# Patient Record
Sex: Male | Born: 1970
Health system: Southern US, Community
[De-identification: ages and names within clinical notes are randomized; demographics above are authoritative.]

## PROBLEM LIST (undated history)

## (undated) DIAGNOSIS — S83281A Other tear of lateral meniscus, current injury, right knee, initial encounter: Secondary | ICD-10-CM

## (undated) DIAGNOSIS — J189 Pneumonia, unspecified organism: Secondary | ICD-10-CM

## (undated) DIAGNOSIS — C4491 Basal cell carcinoma of skin, unspecified: Secondary | ICD-10-CM

## (undated) DIAGNOSIS — T7840XA Allergy, unspecified, initial encounter: Secondary | ICD-10-CM

## (undated) DIAGNOSIS — G4730 Sleep apnea, unspecified: Secondary | ICD-10-CM

## (undated) DIAGNOSIS — K219 Gastro-esophageal reflux disease without esophagitis: Secondary | ICD-10-CM

## (undated) HISTORY — DX: Gastro-esophageal reflux disease without esophagitis: K21.9

## (undated) HISTORY — PX: CARPAL TUNNEL RELEASE: SHX101

## (undated) HISTORY — DX: Sleep apnea, unspecified: G47.30

## (undated) HISTORY — DX: Allergy, unspecified, initial encounter: T78.40XA

---
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
## 2004-09-09 ENCOUNTER — Emergency Department (HOSPITAL_COMMUNITY): Admission: EM | Admit: 2004-09-09 | Discharge: 2004-09-09 | Payer: Self-pay | Admitting: Family Medicine

## 2008-10-26 ENCOUNTER — Ambulatory Visit: Payer: Self-pay | Admitting: Obstetrics & Gynecology

## 2008-10-27 ENCOUNTER — Encounter: Payer: Self-pay | Admitting: Obstetrics & Gynecology

## 2008-10-27 LAB — CONVERTED CEMR LAB
Barbiturate Quant, Ur: NEGATIVE
Benzodiazepines.: NEGATIVE
Cocaine Metabolites: NEGATIVE
Creatinine,U: 68.2 mg/dL
Methadone: NEGATIVE

## 2011-08-25 ENCOUNTER — Institutional Professional Consult (permissible substitution): Payer: Self-pay | Admitting: Pulmonary Disease

## 2011-09-06 ENCOUNTER — Encounter: Payer: Self-pay | Admitting: Pulmonary Disease

## 2011-09-06 ENCOUNTER — Ambulatory Visit (INDEPENDENT_AMBULATORY_CARE_PROVIDER_SITE_OTHER): Payer: 59 | Admitting: Pulmonary Disease

## 2011-09-06 VITALS — BP 140/98 | HR 73 | Temp 98.8°F | Ht 74.0 in | Wt 236.0 lb

## 2011-09-06 DIAGNOSIS — G4733 Obstructive sleep apnea (adult) (pediatric): Secondary | ICD-10-CM | POA: Insufficient documentation

## 2011-09-06 DIAGNOSIS — R0683 Snoring: Secondary | ICD-10-CM

## 2011-09-06 DIAGNOSIS — R0609 Other forms of dyspnea: Secondary | ICD-10-CM

## 2011-09-06 NOTE — Patient Instructions (Addendum)
Will do home sleep test to evaluate for sleep apnea Work on weight loss Will call with results of study.

## 2011-09-06 NOTE — Assessment & Plan Note (Signed)
The patient has significant snoring, and abnormal breathing pattern during sleep, and does give a history that suggests some degree of sleep fragmentation.  He is overweight, and does have a large neck, and therefore is at risk for sleep disordered breathing.  He also has some degree of sleepiness during his awake hours.  I think he needs to have a sleep study in order to evaluate for clinically significant sleep apnea.  He is very healthy, and does not have issues with insomnia.  He is therefore a good candidate for home sleep testing.

## 2011-09-06 NOTE — Progress Notes (Signed)
  Subjective:    Patient ID: Ricky Hawkins, male    DOB: 03/03/1970, 41 y.o.   MRN: [id number]  HPI The pt is a 41 y/o male who comes in today for evaluation of loud snoring and possibly osa.  His wife notes that he has snoring, as well as an abnormal breathing pattern during sleep.  He usually awakens 3 times a night, and is rested only 50% of the mornings.  He stays very active at work, and therefore is unsure if he has an issue with sleep pressure.  He does admit that he can doze in the evenings watching television or movies.  He also has sleep pressure while driving longer distances on occasion.  He states that his weight is up about 3-4 pounds over the last few years, and his Epworth score today is only 3.  His neck size is 16-1/2 inches.  Sleep Questionnaire: What time do you typically go to bed?( Between what hours) 11pm How long does it take you to fall asleep? 30 minutes How many times during the night do you wake up? 2 What time do you get out of bed to start your day? 0600 Do you drive or operate heavy machinery in your occupation? No How much has your weight changed (up or down) over the past two years? (In pounds) 4 lb (1.814 kg) Have you ever had a sleep study before? No Do you currently use CPAP? No Do you wear oxygen at any time? No    Review of Systems  Constitutional: Negative for fever and unexpected weight change.  HENT: Negative for ear pain, nosebleeds, congestion, sore throat, rhinorrhea, sneezing, trouble swallowing, dental problem, postnasal drip and sinus pressure.   Eyes: Negative for redness and itching.  Respiratory: Negative for cough, chest tightness, shortness of breath and wheezing.   Cardiovascular: Negative for palpitations and leg swelling.  Gastrointestinal: Negative for nausea and vomiting.  Genitourinary: Negative for dysuria.  Musculoskeletal: Positive for joint swelling.  Skin: Negative for rash.  Neurological: Negative for headaches.  Hematological: Does  not bruise/bleed easily.  Psychiatric/Behavioral: Negative for dysphoric mood. The patient is not nervous/anxious.        Objective:   Physical Exam Constitutional:  Well developed, no acute distress  HENT:  Nares patent without discharge, but mild deviation to left  Oropharynx without exudate, palate and uvula are mildly elongated.   Eyes:  Perrla, eomi, no scleral icterus  Neck:  No JVD, no TMG  Cardiovascular:  Normal rate, regular rhythm, no rubs or gallops.  No murmurs        Intact distal pulses  Pulmonary :  Normal breath sounds, no stridor or respiratory distress   No rales, rhonchi, or wheezing  Abdominal:  Soft, nondistended, bowel sounds present.  No tenderness noted.   Musculoskeletal:  No lower extremity edema noted.  Lymph Nodes:  No cervical lymphadenopathy noted  Skin:  No cyanosis noted  Neurologic:  Alert, appropriate, moves all 4 extremities without obvious deficit.         Assessment & Plan:

## 2011-09-12 ENCOUNTER — Telehealth: Payer: Self-pay | Admitting: Pulmonary Disease

## 2011-09-12 NOTE — Telephone Encounter (Signed)
Pt needs ov to review recent sleep study.  Let him know is abnormal. If I don't have anything soon, can double book somewhere.

## 2011-09-13 ENCOUNTER — Ambulatory Visit (INDEPENDENT_AMBULATORY_CARE_PROVIDER_SITE_OTHER): Payer: 59 | Admitting: Pulmonary Disease

## 2011-09-13 DIAGNOSIS — R0683 Snoring: Secondary | ICD-10-CM

## 2011-09-13 DIAGNOSIS — G4733 Obstructive sleep apnea (adult) (pediatric): Secondary | ICD-10-CM

## 2011-09-13 NOTE — Telephone Encounter (Signed)
Will forward to Lori per her request.  

## 2011-09-18 NOTE — Telephone Encounter (Signed)
Pt is scheduled for follow-up on home sleep test Wed., 09/20/11 @ 2:45.

## 2011-09-20 ENCOUNTER — Ambulatory Visit (INDEPENDENT_AMBULATORY_CARE_PROVIDER_SITE_OTHER): Payer: 59 | Admitting: Pulmonary Disease

## 2011-09-20 ENCOUNTER — Encounter: Payer: Self-pay | Admitting: Pulmonary Disease

## 2011-09-20 VITALS — BP 110/72 | HR 60 | Temp 98.4°F | Ht 76.0 in | Wt 233.8 lb

## 2011-09-20 DIAGNOSIS — R0989 Other specified symptoms and signs involving the circulatory and respiratory systems: Secondary | ICD-10-CM

## 2011-09-20 DIAGNOSIS — R0683 Snoring: Secondary | ICD-10-CM

## 2011-09-20 NOTE — Progress Notes (Signed)
  Subjective:    Patient ID: Ricky Hawkins, male    DOB: 13-Apr-1970, 41 y.o.   MRN: [id number]  HPI Patient comes in today for followup after his recent sleep test.  He was found to have mild obstructive sleep apnea, with an AHI of 12 events per hour.  He did have oxygen desaturation as low as 78% transiently.  I have reviewed this study with him in detail, and answered all of his questions.   Review of Systems  Constitutional: Negative for fever and unexpected weight change.  HENT: Negative for ear pain, nosebleeds, congestion, sore throat, rhinorrhea, sneezing, trouble swallowing, dental problem, postnasal drip and sinus pressure.   Eyes: Negative for redness and itching.  Respiratory: Negative for cough, chest tightness, shortness of breath and wheezing.   Cardiovascular: Negative for palpitations and leg swelling.  Gastrointestinal: Negative for nausea and vomiting.  Genitourinary: Negative for dysuria.  Musculoskeletal: Negative for joint swelling.  Skin: Negative for rash.  Neurological: Negative for headaches.  Hematological: Does not bruise/bleed easily.  Psychiatric/Behavioral: Negative for dysphoric mood. The patient is not nervous/anxious.        Objective:   Physical Exam Well-developed male in no acute distress Nose without purulence or discharge noted Lower extremities without edema, no cyanosis Alert and oriented, moves all 4 extremities.       Assessment & Plan:

## 2011-09-20 NOTE — Patient Instructions (Addendum)
Will refer to Dr. Myrtis Ser for consideration of a dental appliance. Work on weight loss.

## 2011-09-20 NOTE — Assessment & Plan Note (Signed)
The patient has been found to have mild obstructive sleep apnea by his recent sleep study.  This is bothering his wife's sleep quite a bit, and he also is having sleep disruption with some daytime sleepiness.  I have reviewed the various treatment options with him, including a trial of weight loss alone, upper airway surgery, dental appliance, and also CPAP.  The patient would like to consider treating his sleep apnea aggressively while he is trying to work on weight loss.  I would not recommend upper airway surgery at this time, but would consider a dental appliance or CPAP.  He travels quite a bit, and is concerned about the inconvenience of CPAP.  He and I both agreed to a referral to dental medicine to be evaluated for possible oral appliance.

## 2011-10-03 ENCOUNTER — Ambulatory Visit (INDEPENDENT_AMBULATORY_CARE_PROVIDER_SITE_OTHER): Payer: 59 | Admitting: *Deleted

## 2011-10-03 ENCOUNTER — Encounter: Payer: Self-pay | Admitting: *Deleted

## 2011-10-03 DIAGNOSIS — Z23 Encounter for immunization: Secondary | ICD-10-CM

## 2011-10-03 MED ORDER — INFLUENZA VIRUS VACC SPLIT PF IM SUSP
0.5000 mL | Freq: Once | INTRAMUSCULAR | Status: AC
  Administered 2011-10-03: 0.5 mL via INTRAMUSCULAR

## 2012-01-03 HISTORY — PX: FRACTURE SURGERY: SHX138

## 2012-10-02 ENCOUNTER — Other Ambulatory Visit: Payer: Self-pay | Admitting: Family Medicine

## 2012-10-02 ENCOUNTER — Other Ambulatory Visit (INDEPENDENT_AMBULATORY_CARE_PROVIDER_SITE_OTHER): Payer: 59 | Admitting: Family Medicine

## 2012-10-02 DIAGNOSIS — Z0283 Encounter for blood-alcohol and blood-drug test: Secondary | ICD-10-CM

## 2012-10-02 DIAGNOSIS — Z021 Encounter for pre-employment examination: Secondary | ICD-10-CM

## 2012-10-02 DIAGNOSIS — Z23 Encounter for immunization: Secondary | ICD-10-CM

## 2012-10-02 MED ORDER — INFLUENZA VIRUS VACC SPLIT PF IM SUSP
0.5000 mL | Freq: Once | INTRAMUSCULAR | Status: AC
  Administered 2012-10-02: 0.5 mL via INTRAMUSCULAR

## 2012-10-03 LAB — DRUG SCREEN, URINE
Benzodiazepines.: NEGATIVE
Methadone: NEGATIVE
Phencyclidine (PCP): NEGATIVE
Propoxyphene: NEGATIVE

## 2012-11-19 ENCOUNTER — Ambulatory Visit (INDEPENDENT_AMBULATORY_CARE_PROVIDER_SITE_OTHER): Payer: 59 | Admitting: Sports Medicine

## 2012-11-19 ENCOUNTER — Encounter: Payer: Self-pay | Admitting: Sports Medicine

## 2012-11-19 VITALS — BP 133/84 | Ht 74.0 in | Wt 225.0 lb

## 2012-11-19 DIAGNOSIS — M7652 Patellar tendinitis, left knee: Secondary | ICD-10-CM

## 2012-11-19 DIAGNOSIS — M7650 Patellar tendinitis, unspecified knee: Secondary | ICD-10-CM

## 2012-11-19 DIAGNOSIS — M25569 Pain in unspecified knee: Secondary | ICD-10-CM

## 2012-11-19 DIAGNOSIS — S83207A Unspecified tear of unspecified meniscus, current injury, left knee, initial encounter: Secondary | ICD-10-CM | POA: Insufficient documentation

## 2012-11-19 MED ORDER — NITROGLYCERIN 0.2 MG/HR TD PT24: MEDICATED_PATCH | TRANSDERMAL | Status: DC

## 2012-11-19 NOTE — Assessment & Plan Note (Signed)
Patient with apparent tear in patellar tendon evidenced on ultrasound. Will treat with nitroglycerin protocol. Patient given handout with protocol and advised on side effects of nitroglycerin. He will be given a patellar strap as well to be worn with activity. Advised on declined squat to help stretch the tendon. Advised on ice massage of this area following activity. Will see in follow-up in 6 weeks.

## 2012-11-19 NOTE — Patient Instructions (Addendum)

## 2012-11-19 NOTE — Progress Notes (Signed)
  Subjective:    Patient ID: Ricky Hawkins, male    DOB: Jan 04, 1970, 42 y.o.   MRN: [id number]  HPI Patient is a 42 yo male new patient who presents for evaluation of left anterior knee pain. Notes 4 years ago he was found to have a partial to slight tear of his meniscus in this leg. His imaging at that time was done at [facility] orth[facility]. He notes he has had intermittent left knee pain for years. He notes current pain worsened starting 6 months ago in his left anterior knee when he started to train for a couch to 5k race. Noted his knee got puffy with this training and painful so he stopped. He notes some pain if he truns the wrong way, though notes no catching or popping. He denies a specific injury.  Sent by wife - Dr Borg  PMH: OSA Surgical Hx: none Medications: none  Review of Systems see HPI     Objective:   Physical Exam Well developed, well nourished  Left knee: no apparent swelling or erythema, patient with good ROM, tenderness to palpation over patellar tendon mid portion and at origin, no joint line tenderness, no noted laxity on ACL, PCL, MCL, and LCL testing, negative mcmurray, mild crepitus on extension of knee, 5/5 strength in quads and hamstrings  Right knee: no apparent swelling or erythema, patient with good ROM, no tenderness to palpation throughout the knee, no noted laxity on ACL, PCL, MCL, and LCL testing, negative mcmurray, 5/5 strength in quads and hamstrings  Ultrasound of left knee: medial meniscus with apparent calcification indicating old bruise or tear, lateral meniscus normal, in longitudinal view midline patellar tendon with spur off the inferior pole of the patella with surrounding hypoechogenicity, in cross section of patellar tendon there is a split in the mid portion with surrounding hypoechogenicity with width of tendon 2.74 cm and width of tear with 0.97 cm, note increased blood flow in this area on doppler, tibial tubercle normal        Assessment &  Plan:  Patellar tendinopathy  Please see individual problem list for plan.

## 2012-12-31 ENCOUNTER — Ambulatory Visit: Payer: 59

## 2013-05-07 ENCOUNTER — Encounter: Payer: Self-pay | Admitting: Sports Medicine

## 2013-05-07 ENCOUNTER — Ambulatory Visit (INDEPENDENT_AMBULATORY_CARE_PROVIDER_SITE_OTHER): Payer: BC Managed Care – PPO | Admitting: Sports Medicine

## 2013-05-07 VITALS — BP 126/75 | Ht 74.0 in | Wt 220.0 lb

## 2013-05-07 DIAGNOSIS — M7650 Patellar tendinitis, unspecified knee: Secondary | ICD-10-CM

## 2013-05-07 DIAGNOSIS — M7652 Patellar tendinitis, left knee: Secondary | ICD-10-CM

## 2013-05-07 MED ORDER — NITROGLYCERIN 0.2 MG/HR TD PT24: MEDICATED_PATCH | TRANSDERMAL | Status: DC

## 2013-05-07 NOTE — Assessment & Plan Note (Signed)
Clinically the patient is still symptomatic and has less sharp pain He has not worsened and can do most activities  On ultrasound there is an improvement with a closing of the gap by 50% as well is less general swelling of the proximal patellar  This is still a high-grade tendon partial tear I think he can continue on conservative treatment as his symptoms are not that severe  Increase the nitroglycerin protocol to one half patch  Increase the weight distress on the decline squats as long as he is pain free  Rescan in 3 months

## 2013-05-07 NOTE — Progress Notes (Signed)
Patient ID: Ricky Hawkins, male   DOB: August 30, 1970, 43 y.o.   MRN: [id number]  Patient has a high-grade tear of his left patellar tendon On last visit we found that this measured 0.97 cm He has been using a quarter patch and nitroglycerin He thinks that there was some decrease in pain but still some tenderness particularly with activities like spin or standing while he was cycling About a month ago running through an airport he felt a sharp pain in the tendon again and worries that he may have reinjured He comes to check on the status of the tendon and see if he should continue this treatment  He has done some of the rehabilitation exercises and has stayed active with other exercise programs in the gym and on the treadmill  Examination  No acute distress, muscular male BP 126/75  Ht 6\' 2"  (1.88 m)  Wt 220 lb (99.791 kg)  BMI 28.23 kg/m2   LT Knee: Normal to inspection with no erythema or effusion or obvious bony abnormalities. Palpation normal with no warmth or joint line tenderness or patellar tenderness or condyle tenderness. ROM normal in flexion and extension and lower leg rotation. Ligaments with solid consistent endpoints including ACL, PCL, LCL, MCL. Negative Mcmurray's and provocative meniscal tests. Non painful patellar compression. Patellar and quadriceps tendons unremarkable. Hamstring and quadriceps strength is normal.  No palpable swelling or tenderness even with pressure over the proximal left patellar tendon  MSK ultrasound The left tendon at the proximal portion measures about 1.0 cm versus the right tendon which measures 0.63 cm There is calcification and slight spurring at the inferior portion of the patellar pole There is hypoechoic change seen on longitudinal and transverse scans The gap in the superior tendon measures 0.45 when it was 0.97 before and the overall appearance looks improved There are some neo-vessels in the tendon

## 2013-05-07 NOTE — Patient Instructions (Signed)
Up the NTG to 1/2 patch daily  The gap is 50% smaller at top of tendon  Add heavier decline squat 3 sets of 10 using both legs  So lighter weight 1 leg decline squat 3 sets of 10 as well  Bike in pain free range  Scan in 3 mos

## 2013-05-22 ENCOUNTER — Ambulatory Visit: Payer: 59 | Admitting: Sports Medicine

## 2013-07-25 ENCOUNTER — Emergency Department (HOSPITAL_COMMUNITY)
Admission: EM | Admit: 2013-07-25 | Discharge: 2013-07-25 | Disposition: A | Discharge status: Home / Self Care | Payer: BC Managed Care – PPO | Source: Home / Self Care | Attending: Family Medicine | Admitting: Family Medicine

## 2013-07-25 ENCOUNTER — Emergency Department (INDEPENDENT_AMBULATORY_CARE_PROVIDER_SITE_OTHER): Payer: BC Managed Care – PPO

## 2013-07-25 ENCOUNTER — Encounter (HOSPITAL_COMMUNITY): Payer: Self-pay | Admitting: Emergency Medicine

## 2013-07-25 DIAGNOSIS — S92919A Unspecified fracture of unspecified toe(s), initial encounter for closed fracture: Secondary | ICD-10-CM

## 2013-07-25 DIAGNOSIS — S92911A Unspecified fracture of right toe(s), initial encounter for closed fracture: Secondary | ICD-10-CM

## 2013-07-25 NOTE — Discharge Instructions (Signed)
Thank you for coming in today. Use tylenol for pain as needed.  Use the post op shoe as needed.  Use the steel insole as needed.  Keep the toes taped for a few weeks.   Toe Fracture Your caregiver has diagnosed you as having a fractured toe. A toe fracture is a break in the bone of a toe. "Buddy taping" is a way of splinting your broken toe, by taping the broken toe to the toe next to it. This "buddy taping" will keep the injured toe from moving beyond normal range of motion. Buddy taping also helps the toe heal in a more normal alignment. It may take 6 to 8 weeks for the toe injury to heal. [place] your toes taped together for as long as directed by your caregiver or until you see a doctor for a follow-up examination. You can change the tape after bathing. Always use a small piece of gauze or cotton between the toes when taping them together. This will help the skin stay dry and prevent infection.  Apply ice to the injury for 15-20 minutes each hour while awake for the first 2 days. Put the ice in a plastic bag and place a towel between the bag of ice and your skin.  After the first 2 days, apply heat to the injured area. Use heat for the next 2 to 3 days. Place a heating pad on the foot or soak the foot in warm water as directed by your caregiver.  Keep your foot elevated as much as possible to lessen swelling.  Wear sturdy, supportive shoes. The shoes should not pinch the toes or fit tightly against the toes.  Your caregiver may prescribe a rigid shoe if your foot is very swollen.  Your may be given crutches if the pain is too great and it hurts too much to walk.  Only take over-the-counter or prescription medicines for pain, discomfort, or fever as directed by your caregiver.  If your caregiver has given you a follow-up appointment, it is very important to keep that appointment. Not keeping the appointment could result in a chronic or permanent injury, pain, and  disability. If there is any problem keeping the appointment, you must call back to this facility for assistance. SEEK MEDICAL CARE IF:   You have increased pain or swelling, not relieved with medications.  The pain does not get better after 1 week.  Your injured toe is cold when the others are warm. SEEK IMMEDIATE MEDICAL CARE IF:   The toe becomes cold, numb, or white.  The toe becomes hot (inflamed) and red. Document Released: 12/17/1999 Document Revised: 03/13/2011 Document Reviewed: 08/05/2007 [facility] Patient Information 2015 [place], [place]. This information is not intended to replace advice given to you by your health care provider. Make sure you discuss any questions you have with your health care provider.

## 2013-07-25 NOTE — ED Provider Notes (Signed)
Ricky Hawkins is a 43 y.o. male who presents to Urgent Care today for this toe injury. Patient stubbed his right fifth toe Monday. He notes pain and swelling. The pain is worse with ambulation. He's tried buddy tape which helps some. He denies any fevers or chills nausea vomiting or diarrhea.   History reviewed. No pertinent past medical history. History  Substance Use Topics  . Smoking status: Never Smoker   . Smokeless tobacco: Never Used  . Alcohol Use: Yes   ROS as above Medications: No current facility-administered medications for this encounter.   Current Outpatient Prescriptions  Medication Sig Dispense Refill  . nitroGLYCERIN (NITRODUR - DOSED IN MG/24 HR) 0.2 mg/hr patch Apply 1/2 of a patch to the left patellar tendon every 24 hours.  30 patch  2  . Testosterone 10 MG/ACT (2%) GEL         Exam:  BP 129/79  Pulse 63  Temp(Src) 98.4 F (36.9 C) (Oral)  Resp 16  Ht 6\' 2"  (1.88 m)  Wt 224 lb (101.606 kg)  BMI 28.75 kg/m2  SpO2 100% Gen: Well NAD Right fifth toe ecchymosis tender and swollen. Capillary refill sensation intact   No results found for this or any previous visit (from the past 24 hour(s)). Dg Foot Complete Right  07/25/2013   CLINICAL DATA:  43 year old male status post blunt trauma with fifth toe pain. Initial encounter.  EXAM: RIGHT FOOT COMPLETE - 3+ VIEW  COMPARISON:  None.  FINDINGS: Comminuted but only minimally displaced fracture at the base of the right fifth proximal phalanx. [name] this is extra-articular, although fracture lucency does extend to the medial margin of the fifth MTP joint. Fifth metatarsal appears intact.  Joint spaces and alignment elsewhere are preserved. Calcaneus intact. No other acute fracture.  IMPRESSION: Comminuted only minimally displaced fracture of the base of the right fifth proximal phalanx, favor extra-articular.   Electronically Signed   By: Lars Pinks M.D.   On: 07/25/2013 11:17    Assessment and Plan: 43 y.o. male with  a right fifth toe fracture.  Buddy tape postoperative shoe Tylenol or NSAIDs.  Discussed warning signs or symptoms. Please see discharge instructions. Patient expresses understanding.   This note was created using Systems analyst. Any transcription errors are unintended.    Gregor Hams, MD 07/25/13 [phone]

## 2013-07-25 NOTE — ED Notes (Signed)
C/o right pinky toe pain due to hitting toe on cabinet wall  It ice and buffy tape toe as tx

## 2013-12-19 ENCOUNTER — Other Ambulatory Visit: Payer: Self-pay | Admitting: Orthopedic Surgery

## 2013-12-19 DIAGNOSIS — M7552 Bursitis of left shoulder: Secondary | ICD-10-CM

## 2013-12-22 ENCOUNTER — Ambulatory Visit
Admission: RE | Admit: 2013-12-22 | Discharge: 2013-12-22 | Disposition: A | Discharge status: Home / Self Care | Payer: BC Managed Care – PPO | Source: Ambulatory Visit | Attending: Orthopedic Surgery | Admitting: Orthopedic Surgery

## 2013-12-22 DIAGNOSIS — M7552 Bursitis of left shoulder: Secondary | ICD-10-CM

## 2014-02-07 ENCOUNTER — Ambulatory Visit (INDEPENDENT_AMBULATORY_CARE_PROVIDER_SITE_OTHER): Payer: 59 | Admitting: Family Medicine

## 2014-02-07 VITALS — BP 120/84 | HR 76 | Temp 98.1°F | Resp 18 | Ht 75.0 in | Wt 231.6 lb

## 2014-02-07 DIAGNOSIS — J029 Acute pharyngitis, unspecified: Secondary | ICD-10-CM

## 2014-02-07 DIAGNOSIS — K120 Recurrent oral aphthae: Secondary | ICD-10-CM

## 2014-02-07 LAB — POCT RAPID STREP A (OFFICE): Rapid Strep A Screen: NEGATIVE

## 2014-02-07 MED ORDER — TRIAMCINOLONE ACETONIDE 0.1 % MT PSTE: 1.0000 "application " | PASTE | Freq: Two times a day (BID) | OROMUCOSAL | Status: DC | PRN

## 2014-02-07 NOTE — Patient Instructions (Addendum)
Your sore throat is likely due to a virus.  I will call you with strep testing results today. See information below on this. Salt water swish and spit, oragel over the counter if needed to sore area, and can apply kenalog paste twice per day to ulcer area. You should receive a call or letter about your lab results within the next week (throat culture). Return to the clinic or go to the nearest emergency room if any of your symptoms worsen or new symptoms occur.  Pharyngitis Pharyngitis is redness, pain, and swelling (inflammation) of your pharynx.  CAUSES  Pharyngitis is usually caused by infection. Most of the time, these infections are from viruses (viral) and are part of a cold. However, sometimes pharyngitis is caused by bacteria (bacterial). Pharyngitis can also be caused by allergies. Viral pharyngitis may be spread from person to person by coughing, sneezing, and personal items or utensils (cups, forks, spoons, toothbrushes). Bacterial pharyngitis may be spread from person to person by more intimate contact, such as kissing.  SIGNS AND SYMPTOMS  Symptoms of pharyngitis include:   Sore throat.   Tiredness (fatigue).   Low-grade fever.   Headache.  Joint pain and muscle aches.  Skin rashes.  Swollen lymph nodes.  Plaque-like film on throat or tonsils (often seen with bacterial pharyngitis). DIAGNOSIS  Your health care provider will ask you questions about your illness and your symptoms. Your medical history, along with a physical exam, is often all that is needed to diagnose pharyngitis. Sometimes, a rapid strep test is done. Other lab tests may also be done, depending on the suspected cause.  TREATMENT  Viral pharyngitis will usually get better in 3-4 days without the use of medicine. Bacterial pharyngitis is treated with medicines that kill germs (antibiotics).  HOME CARE INSTRUCTIONS   Drink enough water and fluids to keep your urine clear or pale yellow.   Only take  over-the-counter or prescription medicines as directed by your health care provider:   If you are prescribed antibiotics, make sure you finish them even if you start to feel better.   Do not take aspirin.   Get lots of rest.   Gargle with 8 oz of salt water ( tsp of salt per 1 qt of water) as often as every 1-2 hours to soothe your throat.   Throat lozenges (if you are not at risk for choking) or sprays may be used to soothe your throat. SEEK MEDICAL CARE IF:   You have large, tender lumps in your neck.  You have a rash.  You cough up green, yellow-brown, or bloody spit. SEEK IMMEDIATE MEDICAL CARE IF:   Your neck becomes stiff.  You drool or are unable to swallow liquids.  You vomit or are unable to keep medicines or liquids down.  You have severe pain that does not go away with the use of recommended medicines.  You have trouble breathing (not caused by a stuffy nose). MAKE SURE YOU:   Understand these instructions.  Will watch your condition.  Will get help right away if you are not doing well or get worse. Document Released: 12/19/2004 Document Revised: 10/09/2012 Document Reviewed: 08/26/2012 [facility] Patient Information 2015 [place], [place]. This information is not intended to replace advice given to you by your health care provider. Make sure you discuss any questions you have with your health care provider.  Sore Throat A sore throat is pain, burning, irritation, or scratchiness of the throat. There is often pain or tenderness when  swallowing or talking. A sore throat may be accompanied by other symptoms, such as coughing, sneezing, fever, and swollen neck glands. A sore throat is often the first sign of another sickness, such as a cold, flu, strep throat, or mononucleosis (commonly known as mono). Most sore throats go away without medical treatment. CAUSES  The most common causes of a sore throat include:  A viral infection, such as a cold, flu, or  mono.  A bacterial infection, such as strep throat, tonsillitis, or whooping cough.  Seasonal allergies.  Dryness in the air.  Irritants, such as smoke or pollution.  Gastroesophageal reflux disease (GERD). HOME CARE INSTRUCTIONS   Only take over-the-counter medicines as directed by your caregiver.  Drink enough fluids to keep your urine clear or pale yellow.  Rest as needed.  Try using throat sprays, lozenges, or sucking on hard candy to ease any pain (if older than 4 years or as directed).  Sip warm liquids, such as broth, herbal tea, or warm water with honey to relieve pain temporarily. You may also eat or drink cold or frozen liquids such as frozen ice pops.  Gargle with salt water (mix 1 tsp salt with 8 oz of water).  Do not smoke and avoid secondhand smoke.  Put a cool-mist humidifier in your bedroom at night to moisten the air. You can also turn on a hot shower and sit in the bathroom with the door closed for 5-10 minutes. SEEK IMMEDIATE MEDICAL CARE IF:  You have difficulty breathing.  You are unable to swallow fluids, soft foods, or your saliva.  You have increased swelling in the throat.  Your sore throat does not get better in 7 days.  You have nausea and vomiting.  You have a fever or persistent symptoms for more than 2-3 days.  You have a fever and your symptoms suddenly get worse. MAKE SURE YOU:   Understand these instructions.  Will watch your condition.  Will get help right away if you are not doing well or get worse. Document Released: 01/27/2004 Document Revised: 12/06/2011 Document Reviewed: 08/27/2011 [facility] Patient Information 2015 [place], [place]. This information is not intended to replace advice given to you by your health care provider. Make sure you discuss any questions you have with your health care provider.

## 2014-02-07 NOTE — Progress Notes (Addendum)
Subjective:  This chart was scribed for Merri Ray, MD, by Starleen Arms, Medical Scribe. This patient was seen in Rm 13 and the patient's care was started at 11:14 AM.   Patient ID: Ricky Hawkins, male    DOB: Mar 05, 1970, 44 y.o.   MRN: [id number]  HPI  Ricky Hawkins is a 44 y.o. male who presents to [facility] complaining of a sore throat onset several days ago with worsening since onset.  He also reports an associated oral ulcer onset two days ago with worsening since onset, small white oral lesions, and a headache.  He has also noted some glandular swelling on his neck.  Patient has used oral hydrogen peroxide, ibuprofen 600 mg twice daily, and sudafed this morning.  Patient reports his 78 year old daughter has an ear infection being treated with amoxicillin.  Patient denies other sick contacts.  Patient denies fever, ear pain, SOB.    FBP:ZWCHENI,DPOEUMP W, MD  Patient Active Problem List   Diagnosis Date Noted  . Patellar tendinitis of left knee 11/19/2012  . OSA (obstructive sleep apnea) 09/06/2011   Past Medical History  Diagnosis Date  . GERD (gastroesophageal reflux disease)    Past Surgical History  Procedure Laterality Date  . Hand surgery    . Fracture surgery     No Known Allergies Prior to Admission medications   Medication Sig Start Date End Date Taking? Authorizing Provider  IBUPROFEN PO Take by mouth.   Yes Historical Provider, MD  nitroGLYCERIN (NITRODUR - DOSED IN MG/24 HR) 0.2 mg/hr patch Apply 1/2 of a patch to the left patellar tendon every 24 hours. 05/07/13  Yes Stefanie Libel, MD  Testosterone 10 MG/ACT (2%) GEL  03/14/13  Yes Historical Provider, MD      Review of Systems  Constitutional: Negative for fever.  HENT: Positive for sore throat. Negative for ear pain.   Respiratory: Negative for shortness of breath.   Neurological: Positive for headaches.       Objective:   Filed Vitals:   02/07/14 1106  BP: 120/84  Pulse: 76  Temp: 98.1 F (36.7 C)    TempSrc: Oral  Resp: 18  Height: 6\' 3"  (1.905 m)  Weight: 231 lb 9.6 oz (105.053 kg)  SpO2: 95%    Physical Exam  Constitutional: He is oriented to person, place, and time. He appears well-developed and well-nourished.  HENT:  Head: Normocephalic and atraumatic.  Right Ear: Tympanic membrane, external ear and ear canal normal.  Left Ear: Tympanic membrane, external ear and ear canal normal.  Nose: No rhinorrhea.  Mouth/Throat: Oropharynx is clear and moist and mucous membranes are normal. No oropharyngeal exudate or posterior oropharyngeal erythema.  5-6 mm abscess ulcer anterior to the left ttonsilar area.  Slight cryptic appearance of tonsils bilaterallyy.  Without exudate or hypertrophy.   Eyes: Conjunctivae are normal. Pupils are equal, round, and reactive to light.  Neck: Neck supple.  Cardiovascular: Normal rate, regular rhythm, normal heart sounds and intact distal pulses.   No murmur heard. Pulmonary/Chest: Effort normal and breath sounds normal. He has no wheezes. He has no rhonchi. He has no rales.  Abdominal: Soft. There is no tenderness.  Lymphadenopathy:    He has no cervical adenopathy.  Neurological: He is alert and oriented to person, place, and time.  Skin: Skin is warm and dry. No rash noted.  Psychiatric: He has a normal mood and affect. His behavior is normal.  Vitals reviewed.  Results for orders placed or performed in  visit on 02/07/14  POCT rapid strep A  Result Value Ref Range   Rapid Strep A Screen Negative Negative       Assessment & Plan:   Ricky Hawkins is a 44 y.o. male Sore throat - Plan: POCT rapid strep A, Culture, Group A Strep  Aphthous ulcer of mouth - Plan: triamcinolone (KENALOG) 0.1 % paste  Suspected viral pharyngitis and apthous ulcer on L.  Advised of negative rapid strep results by phone. Throat cx pending. Sx care discussed, and Rx. kenalog Orabase if needed. rtc precautions.    Meds ordered this encounter  Medications  .  IBUPROFEN PO    Sig: Take by mouth.  . triamcinolone (KENALOG) 0.1 % paste    Sig: Use as directed 1 application in the mouth or throat 2 (two) times daily as needed.    Dispense:  5 g    Refill:  0   Patient Instructions  Your sore throat is likely due to a virus.  I will call you with strep testing results today. See information below on this. Salt water swish and spit, oragel over the counter if needed to sore area, and can apply kenalog paste twice per day to ulcer area. You should receive a call or letter about your lab results within the next week (throat culture). Return to the clinic or go to the nearest emergency room if any of your symptoms worsen or new symptoms occur.  Pharyngitis Pharyngitis is redness, pain, and swelling (inflammation) of your pharynx.  CAUSES  Pharyngitis is usually caused by infection. Most of the time, these infections are from viruses (viral) and are part of a cold. However, sometimes pharyngitis is caused by bacteria (bacterial). Pharyngitis can also be caused by allergies. Viral pharyngitis may be spread from person to person by coughing, sneezing, and personal items or utensils (cups, forks, spoons, toothbrushes). Bacterial pharyngitis may be spread from person to person by more intimate contact, such as kissing.  SIGNS AND SYMPTOMS  Symptoms of pharyngitis include:   Sore throat.   Tiredness (fatigue).   Low-grade fever.   Headache.  Joint pain and muscle aches.  Skin rashes.  Swollen lymph nodes.  Plaque-like film on throat or tonsils (often seen with bacterial pharyngitis). DIAGNOSIS  Your health care provider will ask you questions about your illness and your symptoms. Your medical history, along with a physical exam, is often all that is needed to diagnose pharyngitis. Sometimes, a rapid strep test is done. Other lab tests may also be done, depending on the suspected cause.  TREATMENT  Viral pharyngitis will usually get better in 3-4  days without the use of medicine. Bacterial pharyngitis is treated with medicines that kill germs (antibiotics).  HOME CARE INSTRUCTIONS   Drink enough water and fluids to keep your urine clear or pale yellow.   Only take over-the-counter or prescription medicines as directed by your health care provider:   If you are prescribed antibiotics, make sure you finish them even if you start to feel better.   Do not take aspirin.   Get lots of rest.   Gargle with 8 oz of salt water ( tsp of salt per 1 qt of water) as often as every 1-2 hours to soothe your throat.   Throat lozenges (if you are not at risk for choking) or sprays may be used to soothe your throat. SEEK MEDICAL CARE IF:   You have large, tender lumps in your neck.  You have a rash.  You cough up green, yellow-brown, or bloody spit. SEEK IMMEDIATE MEDICAL CARE IF:   Your neck becomes stiff.  You drool or are unable to swallow liquids.  You vomit or are unable to keep medicines or liquids down.  You have severe pain that does not go away with the use of recommended medicines.  You have trouble breathing (not caused by a stuffy nose). MAKE SURE YOU:   Understand these instructions.  Will watch your condition.  Will get help right away if you are not doing well or get worse. Document Released: 12/19/2004 Document Revised: 10/09/2012 Document Reviewed: 08/26/2012 [facility] Patient Information 2015 [place], [place]. This information is not intended to replace advice given to you by your health care provider. Make sure you discuss any questions you have with your health care provider.  Sore Throat A sore throat is pain, burning, irritation, or scratchiness of the throat. There is often pain or tenderness when swallowing or talking. A sore throat may be accompanied by other symptoms, such as coughing, sneezing, fever, and swollen neck glands. A sore throat is often the first sign of another sickness, such as a cold,  flu, strep throat, or mononucleosis (commonly known as mono). Most sore throats go away without medical treatment. CAUSES  The most common causes of a sore throat include:  A viral infection, such as a cold, flu, or mono.  A bacterial infection, such as strep throat, tonsillitis, or whooping cough.  Seasonal allergies.  Dryness in the air.  Irritants, such as smoke or pollution.  Gastroesophageal reflux disease (GERD). HOME CARE INSTRUCTIONS   Only take over-the-counter medicines as directed by your caregiver.  Drink enough fluids to keep your urine clear or pale yellow.  Rest as needed.  Try using throat sprays, lozenges, or sucking on hard candy to ease any pain (if older than 4 years or as directed).  Sip warm liquids, such as broth, herbal tea, or warm water with honey to relieve pain temporarily. You may also eat or drink cold or frozen liquids such as frozen ice pops.  Gargle with salt water (mix 1 tsp salt with 8 oz of water).  Do not smoke and avoid secondhand smoke.  Put a cool-mist humidifier in your bedroom at night to moisten the air. You can also turn on a hot shower and sit in the bathroom with the door closed for 5-10 minutes. SEEK IMMEDIATE MEDICAL CARE IF:  You have difficulty breathing.  You are unable to swallow fluids, soft foods, or your saliva.  You have increased swelling in the throat.  Your sore throat does not get better in 7 days.  You have nausea and vomiting.  You have a fever or persistent symptoms for more than 2-3 days.  You have a fever and your symptoms suddenly get worse. MAKE SURE YOU:   Understand these instructions.  Will watch your condition.  Will get help right away if you are not doing well or get worse. Document Released: 01/27/2004 Document Revised: 12/06/2011 Document Reviewed: 08/27/2011 [facility] Patient Information 2015 [place], [place]. This information is not intended to replace advice given to you by your health  care provider. Make sure you discuss any questions you have with your health care provider.      I personally performed the services described in this documentation, which was scribed in my presence. The recorded information has been reviewed and considered, and addended by me as needed.

## 2014-02-09 LAB — CULTURE, GROUP A STREP: ORGANISM ID, BACTERIA: NORMAL

## 2014-03-10 ENCOUNTER — Encounter: Payer: Self-pay | Admitting: Sports Medicine

## 2014-03-10 ENCOUNTER — Ambulatory Visit (INDEPENDENT_AMBULATORY_CARE_PROVIDER_SITE_OTHER): Payer: 59 | Admitting: Sports Medicine

## 2014-03-10 VITALS — BP 148/79 | HR 80 | Ht 74.0 in | Wt 225.0 lb

## 2014-03-10 DIAGNOSIS — M7652 Patellar tendinitis, left knee: Secondary | ICD-10-CM

## 2014-03-10 MED ORDER — NITROGLYCERIN 0.2 MG/HR TD PT24: MEDICATED_PATCH | TRANSDERMAL | Status: DC

## 2014-03-10 NOTE — Progress Notes (Signed)
   HPI:  Patient presents for follow-up of left knee pain. He has been using nitroglycerin patches for about 8 months for patellar tendon partial tear, very in out about 3 weeks ago. He recently started a running program. He wants to make sure that he is safe to continue running. He has no current issues with pain in his left knee. He avoids deep squats. He's been using the patellar strap. He was previously using a third of a patch and nitroglycerin, which caused a headache. He then transitioned to one quarter of the patch.  ROS: See HPI  [place]: History of obstructive sleep apnea  PHYSICAL EXAM: BP 148/79 mmHg  Pulse 80  Ht 6\' 2"  (1.88 m)  Wt 225 lb (102.059 kg)  BMI 28.88 kg/m2 Gen: No acute distress, pleasant, cooperative MSK: Bilateral knees with mildly thickened patellar tendons. Left patella is mobile. No effusion present. MCL and LCL intact to varus and valgus stressing on left side. Lachman's negative. No tenderness over the knee. Meniscus testing is normal  [place]:  At original visit had ~ 40% tear with lots of hypoechoic change deep surface prox PT Now has some calcification and only mild hypoechoic change with < 10 % tear at same location No effusion SPP  ASSESSMENT/PLAN:  Patellar tendinitis of left knee MSK ultrasound performed today and shows improvement in the area of patellar tendon tearing. patient is safe to continue running. He was given a new patellar strap. He'll follow-up in about 3 months for repeat ultrasound to prove resolution. We did refill his nitroglycerin today.    FOLLOW UP: F/u in 3 months for repeat ultrasound  SIGNED: [place] J. Ardelia Mems, MD Family Medicine Resident PGY-[address] with plan    Ila Mcgill, MD

## 2014-03-10 NOTE — Patient Instructions (Addendum)
Refilled your nitroglycerin patch Continue the patellar strap as needed Extra caution with deep squats, etc. Follow up in 3-6 months

## 2014-03-11 NOTE — Assessment & Plan Note (Signed)
MSK ultrasound performed today and shows improvement in the area of patellar tendon tearing. patient is safe to continue running. He was given a new patellar strap. He'll follow-up in about 3 months for repeat ultrasound to prove resolution. We did refill his nitroglycerin today.

## 2014-05-13 ENCOUNTER — Ambulatory Visit (INDEPENDENT_AMBULATORY_CARE_PROVIDER_SITE_OTHER): Payer: 59 | Admitting: Sports Medicine

## 2014-05-13 ENCOUNTER — Encounter: Payer: Self-pay | Admitting: Sports Medicine

## 2014-05-13 VITALS — BP 125/79 | HR 60 | Ht 74.0 in | Wt 225.0 lb

## 2014-05-13 DIAGNOSIS — M7652 Patellar tendinitis, left knee: Secondary | ICD-10-CM

## 2014-05-13 NOTE — Progress Notes (Signed)
   Subjective:    Patient ID: Ricky Hawkins, male    DOB: 1970-04-14, 44 y.o.   MRN: [id number]  HPI Ricky Hawkins is a 44 year old male who presents for follow-up evaluation of left knee pain related to a previous patellar tendon tear he sustained last year. Recall that his initial ultrasound showed a 40% partial thickness tear of the patellar tendon. He has weaned off the nitroglycerin patch and has been exercising without difficulty. He recently ran a triathlon without difficult the and finished second for his class. He denies any persistent pain, numbness, tingling, or weakness. No catching, popping, or giving way.  Past medical history, social history, medications, and allergies were reviewed and are up to date in the chart.  Review of Systems 7 point review of systems was performed and was otherwise negative unless noted in the history of present illness.     Objective:   Physical Exam BP 125/79 mmHg  Pulse 60  Ht 6\' 2"  (1.88 m)  Wt 225 lb (102.059 kg)  BMI 28.88 kg/m2 GEN: The patient is well-developed well-nourished male and in no acute distress.  He is awake alert and oriented x3. SKIN: warm and well-perfused, no rash  EXTR: No lower extremity edema or calf tenderness Neuro: Strength 5/5 globally. Sensation intact throughout. No focal deficits. Vasc: +2 bilateral distal pulses. No edema.  MSK: Examination of the left knee reveals full range of motion. No effusion. No overlying erythema or induration. No instability with valgus or varus stress testing. The quadriceps and patellar tendons are grossly intact. No joint line tenderness to palpation.  Limited musculoskeletal ultrasound: Long and short axis views were obtained of the left knee with emphasis on the patellar tendon. There appears to be a small microcalcification located at the inferior patellar pole in the region of the initial tendon split. The previously seen partial thickness tear of the patellar tendon has since resolved  and filled in with scar tissue. The patellar tendon caliber measures approximately 0.75 today compared to 0.95 at previous exam. In short axis the area of previously seen tear appears to have filled in.    Assessment & Plan:  Please see problem based assessment and plan in the problem list.

## 2014-05-13 NOTE — Assessment & Plan Note (Signed)
Resolved initial 40% partial thickness tear of the patellar tendon. Sonographically resolved. -The patient will advance his activities as tolerated and follow-up on an as-needed basis. -We counseled him about the risk of rerupture, and counseled him to try to avoid deep lunges or squats going forward as the scar tissue may not be as strong as his native tissue. -We'll see him back if needed.

## 2014-09-09 ENCOUNTER — Ambulatory Visit: Payer: 59 | Admitting: Sports Medicine

## 2014-09-09 ENCOUNTER — Encounter: Payer: Self-pay | Admitting: Sports Medicine

## 2014-09-09 ENCOUNTER — Ambulatory Visit (INDEPENDENT_AMBULATORY_CARE_PROVIDER_SITE_OTHER): Payer: 59 | Admitting: Sports Medicine

## 2014-09-09 VITALS — BP 123/80 | Ht 74.0 in | Wt 225.0 lb

## 2014-09-09 DIAGNOSIS — M25571 Pain in right ankle and joints of right foot: Secondary | ICD-10-CM

## 2014-09-09 DIAGNOSIS — S93401A Sprain of unspecified ligament of right ankle, initial encounter: Secondary | ICD-10-CM | POA: Insufficient documentation

## 2014-09-09 NOTE — Patient Instructions (Signed)
Thanks for coming in today.   Work on balance with your ankle.   1. Balance with your eyes closed on one foot until you can reach 30-45 seconds on each foot without support. Perform this at least daily.   2. Cone Touch - place an object out in front of you, balance on one foot and then reach down to touch the object, alternating sides. Do this until you can touch the object 30 times without requiring support. Perform this at least daily.   3. Balance on the Side of one foot - do this after you have achieved the first two. Balance on the side of one foot without using your big toe until you can achieve this without support for 30 seconds. Do this exercise daily after you have achieved the first two.   4. While balancing on one foot, bounce a ball off of a wall and catch it when it bounces back to you. Do this in alternating positions from straight to each side. Perform this 2-3 times / week.   Do not return to running for 2-3 weeks until you have a better sense of balance with your ankle. Otherwise, continue to exercise (including cycling without intense hill climbs) as you have been.    Return in 1 month for follow up.   Thanks for letting us take care of you.   Sincerely,  Paula Compton, MD

## 2014-09-09 NOTE — Progress Notes (Signed)
[facility] Ricky Hacker, MD Phone: [phone]  Subjective:   # Right Ankle Pain / Swelling - Recurrent Ankle Sprain - Pt here for evaluation of right ankle pain / chronic ankle sprain / weakness.  - he says that he broke his right ankle nearly 20 years ago, did not require surgical fixation, was placed in a boot and then ever since has had recurrent ankle sprain.  - He says that he has had sprains of his right ankle nearly 15-20 times since then.  - Each time, he gets pain, swelling, and he feels that he has instability with his ankle especially on uneven surfaces or with lateral motion.  - He has various ankle braces that he has used in the past with some help.  - He otherwise, has no swelling today, and a small amount of pain, though he has kept his ankle wrapped.  - he has not undergone PT for ankle strengthening, and he sometimes takes Alleve to help with the pain.  - He has sprained his left ankle before, but does not do so as frequently as his right ankle.  - He otherwise has no weakness, numbness or tingling of his right foot / ankle.   All relevant systems were reviewed and were negative unless otherwise noted in the HPI  Past Medical History Reviewed problem list.  Medications- reviewed and updated Current Outpatient Prescriptions  Medication Sig Dispense Refill  . doxycycline (VIBRAMYCIN) 100 MG capsule   4  . IBUPROFEN PO Take by mouth.    . nitroGLYCERIN (NITRODUR - DOSED IN MG/24 HR) 0.2 mg/hr patch Apply 1/2 of a patch to the left patellar tendon every 24 hours. 30 patch 2  . Testosterone 10 MG/ACT (2%) GEL     . triamcinolone (KENALOG) 0.1 % paste Use as directed 1 application in the mouth or throat 2 (two) times daily as needed. 5 g 0   No current facility-administered medications for this visit.   Chief complaint-noted No additions to family history Social history- patient is a non smoker  Objective: BP 123/80 mmHg  Ht 6\' 2"  (1.88  m)  Wt 225 lb (102.059 kg)  BMI 28.88 kg/m2 INSPECTION: No gross deformity, very slight swelling on the right, no ecchymosis, gait without frank abnormality, decent arch.  PALPATION: slight tenderness to palpation over the anterior lateral malleolus near the ATFL origin. Otherwise no crepitus, no other tenderness.   RANGE OF MOTION: RT: He has  full ROM on dorsiflexion, and FROM on plantarflexion/ he is tight on / inversion / eversion. Drawer is not lax  FROM in all directions on the left and some increase in PF inversion/ stable drawer STRENGTH: 5/5 dorsiflexion, plantarflexion.  SPECIAL TESTS: Anterior drawer No laxity bilaterally, Talar tilt is with some laxity on the left, but actually reduced laxity on the right, and Kleiger is without laxity bilaterally.  NEUROVASCULAR STATUS: Balance on the right / left was impaired with eyes closed / with cone touch test. Otherwise, no loss of sensation or motor function. Vascularly intact. .    Assessment/Plan:  # Improper Proprioception -  This is likely related to his initial fracture during which the proprioceptive function of the ankle was impaired. This has led to ankle instability due to impaired sensation of joint / foot location with motion. No laxity to indicate ligamentous weakness / damage. Strength is excellent. Needs balance training / proprioception training.  - Will perform proprioception training exercises as instructed over the next  several weeks.  - Advised to ice, and use nsaids as needed.  - Use the ankle brace provided. Soft ankle brace ok given that he has good ligament support. Just needs proprioception improvement.  - return to running after 2-3 weeks as tolerated.  - Follow up in 1 month for evaluation.   Consider trial with compression

## 2014-10-19 ENCOUNTER — Other Ambulatory Visit (INDEPENDENT_AMBULATORY_CARE_PROVIDER_SITE_OTHER): Payer: 59

## 2014-10-19 DIAGNOSIS — Z23 Encounter for immunization: Secondary | ICD-10-CM

## 2014-10-19 MED ORDER — INFLUENZA VAC SPLIT QUAD 0.5 ML IM SUSY: 0.5000 mL | PREFILLED_SYRINGE | Freq: Once | INTRAMUSCULAR | Status: DC

## 2014-10-19 NOTE — Addendum Note (Signed)
Addended by: Gretchen Short on: 10/19/2014 04:30 PM   Modules accepted: Orders

## 2014-10-19 NOTE — Addendum Note (Signed)
Addended by: Gretchen Short on: 10/19/2014 04:25 PM   Modules accepted: Orders

## 2014-10-22 LAB — QUANTIFERON TB GOLD ASSAY (BLOOD)
Interferon Gamma Release Assay: NEGATIVE
Mitogen value: 9.88 IU/mL
Quantiferon Nil Value: 0.02 IU/mL
Quantiferon Tb Ag Minus Nil Value: 0 IU/mL
TB Ag value: 0.02 IU/mL

## 2015-01-03 HISTORY — PX: HAND SURGERY: SHX662

## 2015-04-14 ENCOUNTER — Ambulatory Visit (INDEPENDENT_AMBULATORY_CARE_PROVIDER_SITE_OTHER): Payer: BLUE CROSS/BLUE SHIELD | Admitting: Family Medicine

## 2015-04-14 ENCOUNTER — Encounter: Payer: Self-pay | Admitting: Family Medicine

## 2015-04-14 VITALS — BP 141/88 | HR 67 | Wt 232.0 lb

## 2015-04-14 DIAGNOSIS — G5602 Carpal tunnel syndrome, left upper limb: Secondary | ICD-10-CM | POA: Diagnosis not present

## 2015-04-14 DIAGNOSIS — G5600 Carpal tunnel syndrome, unspecified upper limb: Secondary | ICD-10-CM | POA: Insufficient documentation

## 2015-04-14 NOTE — Patient Instructions (Signed)
Thank you for coming in today. Use the brace especially at night.  Take it easy for next few days.  Call or go to the ER if you develop a large red swollen joint with extreme pain or oozing puss.   Carpal Tunnel Syndrome Carpal tunnel syndrome is a condition that causes pain in your hand and arm. The carpal tunnel is a narrow area located on the palm side of your wrist. Repeated wrist motion or certain diseases may cause swelling within the tunnel. This swelling pinches the main nerve in the wrist (median nerve). CAUSES  This condition may be caused by:   Repeated wrist motions.  Wrist injuries.  Arthritis.  A cyst or tumor in the carpal tunnel.  Fluid buildup during pregnancy. Sometimes the cause of this condition is not known.  RISK FACTORS This condition is more likely to develop in:   People who have jobs that cause them to repeatedly move their wrists in the same motion, such as butchers and cashiers.  Women.  People with certain conditions, such as:  Diabetes.  Obesity.  An underactive thyroid (hypothyroidism).  Kidney failure. SYMPTOMS  Symptoms of this condition include:   A tingling feeling in your fingers, especially in your thumb, index, and middle fingers.  Tingling or numbness in your hand.  An aching feeling in your entire arm, especially when your wrist and elbow are bent for long periods of time.  Wrist pain that goes up your arm to your shoulder.  Pain that goes down into your palm or fingers.  A weak feeling in your hands. You may have trouble grabbing and holding items. Your symptoms may feel worse during the night.  DIAGNOSIS  This condition is diagnosed with a medical history and physical exam. You may also have tests, including:   An electromyogram (EMG). This test measures electrical signals sent by your nerves into the muscles.  X-rays. TREATMENT  Treatment for this condition includes:  Lifestyle changes. It is important to stop  doing or modify the activity that caused your condition.  Physical or occupational therapy.  Medicines for pain and inflammation. This may include medicine that is injected into your wrist.  A wrist splint.  Surgery. HOME CARE INSTRUCTIONS  If You Have a Splint:  Wear it as told by your health care provider. Remove it only as told by your health care provider.  Loosen the splint if your fingers become numb and tingle, or if they turn cold and blue.  Keep the splint clean and dry. General Instructions  Take over-the-counter and prescription medicines only as told by your health care provider.  Rest your wrist from any activity that may be causing your pain. If your condition is work related, talk to your employer about changes that can be made, such as getting a wrist pad to use while typing.  If directed, apply ice to the painful area:  Put ice in a plastic bag.  Place a towel between your skin and the bag.  Leave the ice on for 20 minutes, 2-3 times per day.  Keep all follow-up visits as told by your health care provider. This is important.  Do any exercises as told by your health care provider, physical therapist, or occupational therapist. [place] IF:   You have new symptoms.  Your pain is not controlled with medicines.  Your symptoms get worse.   This information is not intended to replace advice given to you by your health care provider. Make  sure you discuss any questions you have with your health care provider.   Document Released: 12/17/1999 Document Revised: 09/09/2014 Document Reviewed: 05/06/2014 Elsevier Interactive Patient Education [employer].

## 2015-04-14 NOTE — Progress Notes (Signed)
Ricky Hawkins is a 45 y.o. male who presents to [facility] today for left hand pain. Patient notes numbness and tingling to the first 3 digits of his left hand. This is been present for the last several days. He did a great deal of yardwork and weight lifting that preceded these symptoms. He notes off and on over the past several months he'll get occasional hand numbness and tingling especially cycling however it's much worse now. He denies any specific injury. He is concerned he may have carpal tunnel syndrome.   Past Medical History  Diagnosis Date  . GERD (gastroesophageal reflux disease)    Past Surgical History  Procedure Laterality Date  . Hand surgery    . Fracture surgery     Social History  Substance Use Topics  . Smoking status: Never Smoker   . Smokeless tobacco: Never Used  . Alcohol Use: Yes   family history includes Heart disease in his father; Hyperlipidemia in his father; Hypertension in his father.  ROS:  No headache, visual changes, nausea, vomiting, diarrhea, constipation, dizziness, abdominal pain, skin rash, fevers, chills, night sweats, weight loss, swollen lymph nodes, body aches, joint swelling, muscle aches, chest pain, shortness of breath, mood changes, visual or auditory hallucinations.    Medications: Current Outpatient Prescriptions  Medication Sig Dispense Refill  . IBUPROFEN PO Take by mouth.    . nitroGLYCERIN (NITRODUR - DOSED IN MG/24 HR) 0.2 mg/hr patch Apply 1/2 of a patch to the left patellar tendon every 24 hours. 30 patch 2  . Testosterone 10 MG/ACT (2%) GEL     . triamcinolone (KENALOG) 0.1 % paste Use as directed 1 application in the mouth or throat 2 (two) times daily as needed. 5 g 0   No current facility-administered medications for this visit.   No Known Allergies   Exam:  BP 141/88 mmHg  Pulse 67  Wt 232 lb (105.235 kg) General: Well Developed, well nourished, and in no acute  distress.  Neuro/Psych: Alert and oriented x3, extra-ocular muscles intact, able to move all 4 extremities, sensation grossly intact. Skin: Warm and dry, no rashes noted.  Respiratory: Not using accessory muscles, speaking in full sentences, trachea midline.  Cardiovascular: Pulses palpable, no extremity edema. Abdomen: Does not appear distended. MSK: Neck nontender to spinal midline normal neck motion Left Shoulder normal motion Left Elbow normal motion. Mild Tinel's overlying the cubital tunnel. Left Wrist normal motion nontender positive Tinel's and Phalen's test. Negative Finkelstein's test. Grip strength pulses capillary refill and sensation are intact bilaterally   Procedure: Real-time Ultrasound Guided hydrodissection of the left median nerve at the carpal tunnel  Device: GE Logiq E  Images permanently stored and available for review in the ultrasound unit. Verbal informed consent obtained. Discussed risks and benefits of procedure. Warned about infection bleeding damage to structures skin hypopigmentation and fat atrophy among others. Patient expresses understanding and agreement Time-out conducted.  Noted no overlying erythema, induration, or other signs of local infection.  Skin prepped in a sterile fashion.  Local anesthesia: Topical Ethyl chloride.  With sterile technique and under real time ultrasound guidance: 40 mg of Kenalog and 2 mL of lidocaine injected easily.  Completed without difficulty  Advised to call if fevers/chills, erythema, induration, drainage, or persistent bleeding.  Images permanently stored and available for review in the ultrasound unit.  Impression: Technically successful ultrasound guided injection.     No results found for this or any previous visit (from the  past 24 hour(s)). No results found.   Please see individual assessment and plan sections.   CC: Haywood Pao, MD

## 2015-04-14 NOTE — Assessment & Plan Note (Signed)
Symptoms consistent with carpal tunnel syndrome. Injection today. Additional plan for wrist splint. Recheck in a few weeks if not better.

## 2015-05-14 DIAGNOSIS — H5213 Myopia, bilateral: Secondary | ICD-10-CM | POA: Diagnosis not present

## 2015-05-14 DIAGNOSIS — H52223 Regular astigmatism, bilateral: Secondary | ICD-10-CM | POA: Diagnosis not present

## 2015-05-14 DIAGNOSIS — H524 Presbyopia: Secondary | ICD-10-CM | POA: Diagnosis not present

## 2015-05-24 ENCOUNTER — Encounter: Payer: Self-pay | Admitting: Family Medicine

## 2015-05-24 ENCOUNTER — Ambulatory Visit (INDEPENDENT_AMBULATORY_CARE_PROVIDER_SITE_OTHER): Payer: BLUE CROSS/BLUE SHIELD | Admitting: Family Medicine

## 2015-05-24 VITALS — BP 128/75 | HR 51 | Wt 223.0 lb

## 2015-05-24 DIAGNOSIS — G5601 Carpal tunnel syndrome, right upper limb: Secondary | ICD-10-CM | POA: Diagnosis not present

## 2015-05-24 DIAGNOSIS — Z9889 Other specified postprocedural states: Secondary | ICD-10-CM | POA: Insufficient documentation

## 2015-05-24 NOTE — Progress Notes (Signed)
Ricky Hawkins is a 45 y.o. male who presents to [facility] today for carpal tunnel syndrome.  Patient was seen last month for left hand carpal tunnel syndrome. He had injection and wrist bracing which helps some. He notes he is about 50-60% better.  However he notes right-handed symptoms. These were initially less than the left hand. He's used an over-the-counter brace which has helped a bit. He notes considerable numbness and tingling that occasionally wake him from sleep. He is unable to do routine weight lifting because of the symptoms bilaterally. He would like an injection to his right carpal tunnel if possible.   Past Medical History  Diagnosis Date  . GERD (gastroesophageal reflux disease)    Past Surgical History  Procedure Laterality Date  . Hand surgery    . Fracture surgery     Social History  Substance Use Topics  . Smoking status: Never Smoker   . Smokeless tobacco: Never Used  . Alcohol Use: Yes   family history includes Heart disease in his father; Hyperlipidemia in his father; Hypertension in his father.  ROS:  No headache, visual changes, nausea, vomiting, diarrhea, constipation, dizziness, abdominal pain, skin rash, fevers, chills, night sweats, weight loss, swollen lymph nodes, body aches, joint swelling, muscle aches, chest pain, shortness of breath, mood changes, visual or auditory hallucinations.    Medications: Current Outpatient Prescriptions  Medication Sig Dispense Refill  . IBUPROFEN PO Take by mouth.    . nitroGLYCERIN (NITRODUR - DOSED IN MG/24 HR) 0.2 mg/hr patch Apply 1/2 of a patch to the left patellar tendon every 24 hours. 30 patch 2  . Testosterone 10 MG/ACT (2%) GEL     . triamcinolone (KENALOG) 0.1 % paste Use as directed 1 application in the mouth or throat 2 (two) times daily as needed. 5 g 0   No current facility-administered medications for this visit.   No Known Allergies   Exam:  BP  128/75 mmHg  Pulse 51  Wt 223 lb (101.152 kg) General: Well Developed, well nourished, and in no acute distress.  Neuro/Psych: Alert and oriented x3, extra-ocular muscles intact, able to move all 4 extremities, sensation grossly intact. Skin: Warm and dry, no rashes noted.  Respiratory: Not using accessory muscles, speaking in full sentences, trachea midline.  Cardiovascular: Pulses palpable, no extremity edema. Abdomen: Does not appear distended. MSK: Right hand normal-appearing with no thenar atrophy. Positive Tinel's overlying the right carpal tunnel.  Procedure: Real-time Ultrasound Guided hydrodissection of the right median nerve at the carpal tunnel  Device: GE Logiq E  Images permanently stored and available for review in the ultrasound unit. Verbal informed consent obtained. Discussed risks and benefits of procedure. Warned about infection bleeding damage to structures skin hypopigmentation and fat atrophy among others. Patient expresses understanding and agreement Time-out conducted.  Noted no overlying erythema, induration, or other signs of local infection.  Skin prepped in a sterile fashion.  Local anesthesia: Topical Ethyl chloride.  With sterile technique and under real time ultrasound guidance: 40 mg of Kenalog and 2 mL of lidocaine injected easily.  Completed without difficulty  Advised to call if fevers/chills, erythema, induration, drainage, or persistent bleeding.  Images permanently stored and available for review in the ultrasound unit.  Impression: Technically successful ultrasound guided injection.   No results found for this or any previous visit (from the past 24 hour(s)). No results found.   45 year old male with bilateral carpal tunnel syndrome Left partially resolved with injection.  Continue splinting. May consider retrial injection in the future if still symptomatic.  The right wrist is quitesymptomatic and  Was injected today. Additionally  we'll treat with wrist splinting. Follow-up in one month.

## 2015-05-24 NOTE — Patient Instructions (Signed)
Thank you for coming in today. Call or go to the ER if you develop a large red swollen joint with extreme pain or oozing puss.  Return in 1 month.  Use the wrist brace.

## 2015-06-02 ENCOUNTER — Telehealth: Payer: Self-pay

## 2015-06-02 NOTE — Telephone Encounter (Signed)
Pt called stating that he is having surgery for carpel tunnel release on his left wrist on 06/07/15 and would like an rx to manage his pain until that time. States that he is getting to relief with aleve. Please advise.

## 2015-06-03 MED ORDER — TRAMADOL HCL 50 MG PO TABS: 50.0000 mg | ORAL_TABLET | Freq: Three times a day (TID) | ORAL | Status: DC | PRN

## 2015-06-03 NOTE — Telephone Encounter (Signed)
I am sorry I could not get Mr Elm better with injections alone.  I have ordered tramadol.

## 2015-06-03 NOTE — Telephone Encounter (Signed)
Pt notified. rx faxed to  [id number] - [place], [place] [id number] (Phone) [phone] (Fax)

## 2015-09-24 ENCOUNTER — Ambulatory Visit (INDEPENDENT_AMBULATORY_CARE_PROVIDER_SITE_OTHER): Payer: BLUE CROSS/BLUE SHIELD | Admitting: Sports Medicine

## 2015-09-24 ENCOUNTER — Encounter: Payer: Self-pay | Admitting: Sports Medicine

## 2015-09-24 DIAGNOSIS — Z23 Encounter for immunization: Secondary | ICD-10-CM

## 2015-09-24 DIAGNOSIS — G5601 Carpal tunnel syndrome, right upper limb: Secondary | ICD-10-CM

## 2015-09-24 NOTE — Assessment & Plan Note (Signed)
5 month response to previous median nerve hydrodissection on the right, repeating today.

## 2015-09-24 NOTE — Progress Notes (Signed)
  Subjective:    CC: Carpal tunnel syndrome  HPI: This is a pleasant 45 year old male, he has known bilateral carpal tunnel syndrome, post carpal tunnel release on the left. Had a median nerve hydrodissection by Dr. Georgina Snell about 6 months ago on the right side and did well, starting to have a recurrence of symptoms, paresthesias and pain and weakness despite nighttime splinting. Desires repeat hydrodissection today.  Past medical history:  Negative.  See flowsheet/record as well for more information.  Surgical history: Negative.  See flowsheet/record as well for more information.  Family history: Negative.  See flowsheet/record as well for more information.  Social history: Negative.  See flowsheet/record as well for more information.  Allergies, and medications have been entered into the medical record, reviewed, and no changes needed.   Review of Systems: No fevers, chills, night sweats, weight loss, chest pain, or shortness of breath.   Objective:    General: Well Developed, well nourished, and in no acute distress.  Neuro: Alert and oriented x3, extra-ocular muscles intact, sensation grossly intact.  HEENT: Normocephalic, atraumatic, pupils equal round reactive to light, neck supple, no masses, no lymphadenopathy, thyroid nonpalpable.  Skin: Warm and dry, no rashes. Cardiac: Regular rate and rhythm, no murmurs rubs or gallops, no lower extremity edema.  Respiratory: Clear to auscultation bilaterally. Not using accessory muscles, speaking in full sentences. Right Wrist: Inspection normal with no visible erythema or swelling. ROM smooth and normal with good flexion and extension and ulnar/radial deviation that is symmetrical with opposite wrist. Palpation is normal over metacarpals, navicular, lunate, and TFCC; tendons without tenderness/ swelling No snuffbox tenderness. No tenderness over Canal of Guyon. Strength 5/5 in all directions without pain. Negative Finkelstein sign, positive  Tinel's and Phalen signs. Negative Watson's test.  Procedure: Real-time Ultrasound Guided right median nerve hydrodissection Device: GE Logiq E  Verbal informed consent obtained.  Time-out conducted.  Noted no overlying erythema, induration, or other signs of local infection.  Skin prepped in a sterile fashion.  Local anesthesia: Topical Ethyl chloride.  With sterile technique and under real time ultrasound guidance:  25-gauge needle advanced into the carpal tunnel and taking care to avoid intraneural injection I injected medication both superficial to and deep to the median nerve, the needle was then redirected and medication injected deep into the carpal tunnel around the flexor tendons. Completed without difficulty  Pain immediately resolved suggesting accurate placement of the medication.  Advised to call if fevers/chills, erythema, induration, drainage, or persistent bleeding.  Images permanently stored and available for review in the ultrasound unit.  Impression: Technically successful ultrasound guided injection.  Impression and Recommendations:    Carpal tunnel syndrome, right 5 month response to previous median nerve hydrodissection on the right, repeating today.

## 2016-01-03 DIAGNOSIS — C4491 Basal cell carcinoma of skin, unspecified: Secondary | ICD-10-CM

## 2016-01-03 HISTORY — DX: Basal cell carcinoma of skin, unspecified: C44.91

## 2016-01-25 DIAGNOSIS — G5601 Carpal tunnel syndrome, right upper limb: Secondary | ICD-10-CM | POA: Diagnosis not present

## 2016-02-04 NOTE — H&P (Signed)
  Ricky Hawkins is an 46 y.o. male.   Chief Complaint: RIGHT HAND CARPAL TUNNEL SYNDROME  HPI: PATIENT HAS HAD CARPAL TUNNEL SYNDROME SYMPTOMS IN THE RIGHT HAND FOR SEVERAL MONTHS. HE HAS FAILED CONSERVATIVE MEASURES.  PATIENT WAS SEEN IN THE OFFICE FOR SURGICAL DISCUSSION. REVIEWED THE SURGICAL PROCEDURE AND POST-OPERATIVE RECOVERY. HE HAS HAD THE CARPAL TUNNEL RELEASE ON THE LEFT HAND AND REMEMBERS THE PROCESS.  PATIENT PRESENTS TODAY FOR SURGERY.  Past Medical History:  Diagnosis Date  . GERD (gastroesophageal reflux disease)     Past Surgical History:  Procedure Laterality Date  . FRACTURE SURGERY    . HAND SURGERY      Family History  Problem Relation Age of Onset  . Heart disease Father   . Hypertension Father   . Hyperlipidemia Father    Social History:  reports that he has never smoked. He has never used smokeless tobacco. He reports that he drinks alcohol. He reports that he does not use drugs.  Allergies: No Known Allergies  No prescriptions prior to admission.    No results found for this or any previous visit (from the past 48 hour(s)). No results found.  ROS NO RECENT ILLNESSES OR HOSPITALIZATIONS  There were no vitals taken for this visit. Physical Exam  General Appearance:  Alert, cooperative, no distress, appears stated age  Head:  Normocephalic, without obvious abnormality, atraumatic  Eyes:  Pupils equal, conjunctiva/corneas clear,         Throat: Lips, mucosa, and tongue normal; teeth and gums normal  Neck: No visible masses     Lungs:   respirations unlabored  Chest Wall:  No tenderness or deformity  Heart:  Regular rate and rhythm,  Abdomen:   Soft, non-tender,         Extremities: Good hand coordination. Normal mood. Alert and oriented to person, place and time. On examination of the right upper extremity, the patient does have no thenar muscle atrophy. He is able to palmar abduct his thumb, extend his thumb, and extend his digits. His  fingertips are warm and well perfused. Good capillary refill. Good blood flow. No ascending erythema or lymphangitis. He has a positive carpal tunnel compression test  Pulses: 2+ and symmetric  Skin: Skin color, texture, turgor normal, no rashes or lesions     Neurologic: Normal    Assessment RIGHT HAND CARPAL TUNNEL SYNDROME  Plan RIGHT HAND CARPAL TUNNEL RELEASE  R/B/A DISCUSSED WITH PT IN OFFICE.  PT VOICED UNDERSTANDING OF PLAN CONSENT SIGNED DAY OF SURGERY PT SEEN AND EXAMINED PRIOR TO OPERATIVE PROCEDURE/DAY OF SURGERY SITE MARKED. QUESTIONS ANSWERED WILL GO HOME FOLLOWING SURGERY  WE ARE PLANNING SURGERY FOR YOUR UPPER EXTREMITY. THE RISKS AND BENEFITS OF SURGERY INCLUDE BUT NOT LIMITED TO BLEEDING INFECTION, DAMAGE TO NEARBY NERVES ARTERIES TENDONS, FAILURE OF SURGERY TO ACCOMPLISH ITS INTENDED GOALS, PERSISTENT SYMPTOMS AND NEED FOR FURTHER SURGICAL INTERVENTION. WITH THIS IN MIND WE WILL PROCEED. I HAVE DISCUSSED WITH THE PATIENT THE PRE AND POSTOPERATIVE REGIMEN AND THE DOS AND DON'TS. PT VOICED UNDERSTANDING AND INFORMED CONSENT SIGNED.  Brynda Peon 02/04/2016, 1:07 PM

## 2016-02-24 ENCOUNTER — Encounter (HOSPITAL_COMMUNITY)
Admission: RE | Disposition: A | Discharge status: Home / Self Care | Payer: Self-pay | Source: Ambulatory Visit | Attending: Orthopedic Surgery

## 2016-02-24 ENCOUNTER — Ambulatory Visit (HOSPITAL_COMMUNITY)
Admission: RE | Admit: 2016-02-24 | Discharge: 2016-02-24 | Disposition: A | Discharge status: Home / Self Care | Payer: 59 | Source: Ambulatory Visit | Attending: Orthopedic Surgery | Admitting: Orthopedic Surgery

## 2016-02-24 ENCOUNTER — Ambulatory Visit (HOSPITAL_COMMUNITY): Payer: 59 | Admitting: Anesthesiology

## 2016-02-24 ENCOUNTER — Encounter (HOSPITAL_COMMUNITY): Payer: Self-pay | Admitting: *Deleted

## 2016-02-24 DIAGNOSIS — Z79899 Other long term (current) drug therapy: Secondary | ICD-10-CM | POA: Diagnosis not present

## 2016-02-24 DIAGNOSIS — G5601 Carpal tunnel syndrome, right upper limb: Secondary | ICD-10-CM | POA: Diagnosis not present

## 2016-02-24 HISTORY — PX: CARPAL TUNNEL RELEASE: SHX101

## 2016-02-24 SURGERY — CARPAL TUNNEL RELEASE
Anesthesia: Monitor Anesthesia Care | Laterality: Right

## 2016-02-24 MED ORDER — ONDANSETRON HCL 4 MG/2ML IJ SOLN
INTRAMUSCULAR | Status: AC
  Filled 2016-02-24: qty 2

## 2016-02-24 MED ORDER — FENTANYL CITRATE (PF) 100 MCG/2ML IJ SOLN
INTRAMUSCULAR | Status: AC
  Filled 2016-02-24: qty 4

## 2016-02-24 MED ORDER — LIDOCAINE 2% (20 MG/ML) 5 ML SYRINGE
INTRAMUSCULAR | Status: AC
  Filled 2016-02-24: qty 5

## 2016-02-24 MED ORDER — PROPOFOL 10 MG/ML IV BOLUS
INTRAVENOUS | Status: AC
  Filled 2016-02-24: qty 20

## 2016-02-24 MED ORDER — OXYCODONE HCL 5 MG PO TABS: 5.0000 mg | ORAL_TABLET | Freq: Once | ORAL | Status: DC | PRN

## 2016-02-24 MED ORDER — BUPIVACAINE HCL (PF) 0.25 % IJ SOLN
INTRAMUSCULAR | Status: AC
  Filled 2016-02-24: qty 30

## 2016-02-24 MED ORDER — CEFAZOLIN SODIUM-DEXTROSE 2-4 GM/100ML-% IV SOLN
2.0000 g | INTRAVENOUS | Status: AC
  Administered 2016-02-24: 2 g via INTRAVENOUS
  Filled 2016-02-24: qty 100

## 2016-02-24 MED ORDER — LIDOCAINE HCL (PF) 1 % IJ SOLN
INTRAMUSCULAR | Status: AC
  Filled 2016-02-24: qty 30

## 2016-02-24 MED ORDER — ONDANSETRON HCL 4 MG/2ML IJ SOLN: 4.0000 mg | Freq: Four times a day (QID) | INTRAMUSCULAR | Status: DC | PRN

## 2016-02-24 MED ORDER — MIDAZOLAM HCL 5 MG/5ML IJ SOLN
INTRAMUSCULAR | Status: DC | PRN
  Administered 2016-02-24: 2 mg via INTRAVENOUS

## 2016-02-24 MED ORDER — LIDOCAINE HCL 1 % IJ SOLN
INTRAMUSCULAR | Status: DC | PRN
  Administered 2016-02-24: 30 mL

## 2016-02-24 MED ORDER — FENTANYL CITRATE (PF) 100 MCG/2ML IJ SOLN: 25.0000 ug | INTRAMUSCULAR | Status: DC | PRN

## 2016-02-24 MED ORDER — FENTANYL CITRATE (PF) 100 MCG/2ML IJ SOLN
INTRAMUSCULAR | Status: DC | PRN
  Administered 2016-02-24 (×2): 100 ug via INTRAVENOUS

## 2016-02-24 MED ORDER — BUPIVACAINE HCL (PF) 0.25 % IJ SOLN
INTRAMUSCULAR | Status: DC | PRN
  Administered 2016-02-24: 30 mL

## 2016-02-24 MED ORDER — LACTATED RINGERS IV SOLN
INTRAVENOUS | Status: DC
  Administered 2016-02-24 (×2): via INTRAVENOUS

## 2016-02-24 MED ORDER — CHLORHEXIDINE GLUCONATE 4 % EX LIQD: 60.0000 mL | Freq: Once | CUTANEOUS | Status: DC

## 2016-02-24 MED ORDER — PROPOFOL 10 MG/ML IV BOLUS
INTRAVENOUS | Status: DC | PRN
  Administered 2016-02-24: 20 mg via INTRAVENOUS
  Administered 2016-02-24: 30 mg via INTRAVENOUS
  Administered 2016-02-24: 50 mg via INTRAVENOUS
  Administered 2016-02-24: 30 mg via INTRAVENOUS
  Administered 2016-02-24: 50 mg via INTRAVENOUS
  Administered 2016-02-24: 20 mg via INTRAVENOUS

## 2016-02-24 MED ORDER — 0.9 % SODIUM CHLORIDE (POUR BTL) OPTIME
TOPICAL | Status: DC | PRN
  Administered 2016-02-24: 1000 mL

## 2016-02-24 MED ORDER — MIDAZOLAM HCL 2 MG/2ML IJ SOLN
INTRAMUSCULAR | Status: AC
  Filled 2016-02-24: qty 2

## 2016-02-24 MED ORDER — HYDROCODONE-ACETAMINOPHEN 5-300 MG PO TABS: 1.0000 | ORAL_TABLET | Freq: Two times a day (BID) | ORAL | 0 refills | Status: DC | PRN

## 2016-02-24 MED ORDER — OXYCODONE HCL 5 MG/5ML PO SOLN: 5.0000 mg | Freq: Once | ORAL | Status: DC | PRN

## 2016-02-24 MED ORDER — ONDANSETRON HCL 4 MG/2ML IJ SOLN
INTRAMUSCULAR | Status: DC | PRN
  Administered 2016-02-24: 4 mg via INTRAVENOUS

## 2016-02-24 MED FILL — HYDROCODON-APAP 5-325: 5-325 | 10 days supply | Qty: 20 | Fill #0

## 2016-02-24 SURGICAL SUPPLY — 51 items
BANDAGE ACE 4X5 VEL STRL LF (GAUZE/BANDAGES/DRESSINGS) ×3 IMPLANT
BANDAGE ELASTIC 3 VELCRO ST LF (GAUZE/BANDAGES/DRESSINGS) ×6 IMPLANT
BNDG CMPR 9X4 STRL LF SNTH (GAUZE/BANDAGES/DRESSINGS) ×1
BNDG CMPR MD 5X2 ELC HKLP STRL (GAUZE/BANDAGES/DRESSINGS) ×1
BNDG CONFORM 3 STRL LF (GAUZE/BANDAGES/DRESSINGS) ×2 IMPLANT
BNDG ELASTIC 2 VLCR STRL LF (GAUZE/BANDAGES/DRESSINGS) ×2 IMPLANT
BNDG ESMARK 4X9 LF (GAUZE/BANDAGES/DRESSINGS) ×3 IMPLANT
BNDG GAUZE ELAST 4 BULKY (GAUZE/BANDAGES/DRESSINGS) ×3 IMPLANT
CORDS BIPOLAR (ELECTRODE) ×3 IMPLANT
COVER SURGICAL LIGHT HANDLE (MISCELLANEOUS) ×3 IMPLANT
CUFF TOURNIQUET SINGLE 18IN (TOURNIQUET CUFF) ×3 IMPLANT
CUFF TOURNIQUET SINGLE 24IN (TOURNIQUET CUFF) IMPLANT
DRAPE SURG 17X23 STRL (DRAPES) ×3 IMPLANT
DRSG TEGADERM 2-3/8X2-3/4 SM (GAUZE/BANDAGES/DRESSINGS) ×2 IMPLANT
DRSG TEGADERM 4X4.75 (GAUZE/BANDAGES/DRESSINGS) ×2 IMPLANT
EVACUATOR 1/8 PVC DRAIN (DRAIN) IMPLANT
GAUZE SPONGE 4X4 12PLY STRL (GAUZE/BANDAGES/DRESSINGS) ×3 IMPLANT
GAUZE XEROFORM 1X8 LF (GAUZE/BANDAGES/DRESSINGS) ×3 IMPLANT
GLOVE BIOGEL PI IND STRL 8.5 (GLOVE) ×1 IMPLANT
GLOVE BIOGEL PI INDICATOR 8.5 (GLOVE) ×2
GLOVE SURG ORTHO 8.0 STRL STRW (GLOVE) ×3 IMPLANT
GOWN STRL REUS W/ TWL LRG LVL3 (GOWN DISPOSABLE) ×2 IMPLANT
GOWN STRL REUS W/ TWL XL LVL3 (GOWN DISPOSABLE) ×1 IMPLANT
GOWN STRL REUS W/TWL LRG LVL3 (GOWN DISPOSABLE) ×6
GOWN STRL REUS W/TWL XL LVL3 (GOWN DISPOSABLE) ×3
KIT BASIN OR (CUSTOM PROCEDURE TRAY) ×3 IMPLANT
KIT ROOM TURNOVER OR (KITS) ×3 IMPLANT
LOOP VESSEL MAXI BLUE (MISCELLANEOUS) IMPLANT
NDL HYPO 25GX1X1/2 BEV (NEEDLE) IMPLANT
NEEDLE HYPO 25GX1X1/2 BEV (NEEDLE) IMPLANT
NS IRRIG 1000ML POUR BTL (IV SOLUTION) ×3 IMPLANT
PACK ORTHO EXTREMITY (CUSTOM PROCEDURE TRAY) ×3 IMPLANT
PAD ARMBOARD 7.5X6 YLW CONV (MISCELLANEOUS) ×6 IMPLANT
PAD CAST 4YDX4 CTTN HI CHSV (CAST SUPPLIES) ×2 IMPLANT
PADDING CAST COTTON 4X4 STRL (CAST SUPPLIES) ×6
SOAP 2 % CHG 4 OZ (WOUND CARE) ×3 IMPLANT
SUCTION FRAZIER HANDLE 10FR (MISCELLANEOUS)
SUCTION TUBE FRAZIER 10FR DISP (MISCELLANEOUS) IMPLANT
SUT PROLENE 4 0 PS 2 18 (SUTURE) ×2 IMPLANT
SUT PROLENE 4 0 TF (SUTURE) ×2 IMPLANT
SUT VIC AB 2-0 CT1 27 (SUTURE)
SUT VIC AB 2-0 CT1 TAPERPNT 27 (SUTURE) IMPLANT
SUT VIC AB 3-0 FS2 27 (SUTURE) IMPLANT
SYR CONTROL 10ML LL (SYRINGE) IMPLANT
SYSTEM CHEST DRAIN TLS 7FR (DRAIN) IMPLANT
TOWEL OR 17X24 6PK STRL BLUE (TOWEL DISPOSABLE) ×3 IMPLANT
TOWEL OR 17X26 10 PK STRL BLUE (TOWEL DISPOSABLE) ×3 IMPLANT
TUBE CONNECTING 12'X1/4 (SUCTIONS)
TUBE CONNECTING 12X1/4 (SUCTIONS) IMPLANT
UNDERPAD 30X30 (UNDERPADS AND DIAPERS) ×3 IMPLANT
WATER STERILE IRR 1000ML POUR (IV SOLUTION) ×3 IMPLANT

## 2016-02-24 NOTE — Anesthesia Preprocedure Evaluation (Signed)
Anesthesia Evaluation  Patient identified by MRN, date of birth, ID band Patient awake    Reviewed: Allergy & Precautions, H&P , NPO status , Patient's Chart, lab work & pertinent test results  Airway Mallampati: II   Neck ROM: full    Dental   Pulmonary sleep apnea ,    breath sounds clear to auscultation       Cardiovascular negative cardio ROS   Rhythm:regular Rate:Normal     Neuro/Psych    GI/Hepatic GERD  ,  Endo/Other    Renal/GU      Musculoskeletal   Abdominal   Peds  Hematology   Anesthesia Other Findings   Reproductive/Obstetrics                             Anesthesia Physical Anesthesia Plan  ASA: II  Anesthesia Plan: MAC   Post-op Pain Management:    Induction: Intravenous  Airway Management Planned: Simple Face Mask  Additional Equipment:   Intra-op Plan:   Post-operative Plan:   Informed Consent: I have reviewed the patients History and Physical, chart, labs and discussed the procedure including the risks, benefits and alternatives for the proposed anesthesia with the patient or authorized representative who has indicated his/her understanding and acceptance.     Plan Discussed with: CRNA, Anesthesiologist and Surgeon  Anesthesia Plan Comments:         Anesthesia Quick Evaluation

## 2016-02-24 NOTE — Transfer of Care (Signed)
Immediate Anesthesia Transfer of Care Note  Patient: Cosimo Schertzer  Procedure(s) Performed: Procedure(s): CARPAL TUNNEL RELEASE (Right)  Patient Location: PACU  Anesthesia Type:MAC  Level of Consciousness: awake, alert , oriented and patient cooperative  Airway & Oxygen Therapy: Patient Spontanous Breathing  Post-op Assessment: Report given to RN, Post -op Vital signs reviewed and stable and Patient moving all extremities  Post vital signs: Reviewed and stable  Last Vitals:  Vitals:   02/24/16 1101 02/24/16 1337  BP: (!) 167/151 117/89  Pulse: 67 (!) 52  Resp: 20 18  Temp: 36.8 C 36.1 C    Last Pain:  Vitals:   02/24/16 1337  TempSrc:   PainSc: (P) 0-No pain      Patients Stated Pain Goal: 8 (25/42/70 6237)  Complications: No apparent anesthesia complications

## 2016-02-24 NOTE — Discharge Instructions (Signed)
KEEP BANDAGE CLEAN AND DRY CALL OFFICE FOR F/U APPT [phone] IN 10 DAYS DR Caralyn Guile CELL [phone] KEEP HAND ELEVATED ABOVE HEART OK TO APPLY ICE TO OPERATIVE AREA CONTACT OFFICE IF ANY WORSENING PAIN OR CONCERNS.

## 2016-02-24 NOTE — Brief Op Note (Signed)
RIGHT CTR GOING HOME F/U IN OFFICE IN 10 DAYS jOB ID [id number]

## 2016-02-25 ENCOUNTER — Encounter (HOSPITAL_COMMUNITY): Payer: Self-pay | Admitting: Orthopedic Surgery

## 2016-02-25 NOTE — Op Note (Signed)
[name], TOURAY NO.:  [id number]  MEDICAL RECORD NO.:  [id number]  LOCATION:                                 FACILITY:  PHYSICIAN:  Linna Hoff IV, M.D.DATE OF BIRTH:  [birth year]  DATE OF PROCEDURE:  02/24/2016 DATE OF DISCHARGE:                              OPERATIVE REPORT   PREOPERATIVE DIAGNOSIS:  Right hand carpal tunnel syndrome.  POSTOPERATIVE DIAGNOSIS:  Right hand carpal tunnel syndrome.  ATTENDING PHYSICIAN:  Linna Hoff, M.D., who scrubbed and present for the entire procedure.  ASSISTANT SURGEON:  None.  ANESTHESIA:  1% Xylocaine, 0.25% Marcaine, local block with IV sedation.  SURGICAL INDICATIONS:  Mr. [name] is a 59 year right-hand-dominant gentleman with signs and symptoms consistent with right hand carpal tunnel syndrome.  The patient had been refractory to conservative treatment.  Risks, benefits, and alternatives were discussed in detail with the patient.  Signed informed consent was obtained.  Risks include, but not limited to bleeding, infection, damage to nearby nerves, arteries, or tendons; loss of motion wrist and digits, incomplete relief of symptoms, and need for further surgical intervention.  DESCRIPTION OF PROCEDURE:  The patient was properly identified in the preoperative holding area and marked by a mark on the right hand to indicate the correct operative site.  The patient was then brought back to the operating room, placed supine on anesthesia table where the IV sedation was administered.  A well-padded tourniquet was placed on right forearm and sealed with a 1000 drape.  Local anesthetic was administered.  The right upper extremity was then prepped and draped in normal sterile fashion.  Time-out was called.  Correct site was identified, and procedure then begun.  Attention was then turned to the right hand.  The limb was then elevated and tourniquet insufflated. Several centimeter incision was made in the  midpalm.  Dissection was carried down the skin and subcutaneous tissue.  Palmar fascia was incised longitudinally exposing the transverse carpal ligament.  Under direct visualization, distal one-half of the transverse carpal ligament released under direct visualization.  Freer elevator was then placed underneath the transverse carpal ligament.  This was very tight and rather significant compression at the transverse carpal ligament.  The Soil scientist was used on the undersurface of the transverse carpal ligament to protect the nerve.  With protection of the nerve the nerve was then released proximally as well as portion of the antebrachial fascia under direct visualization.  The wound was then thoroughly irrigated.  The skin was then closed with horizontal mattress Prolene sutures after the tourniquet had been deflated.  Hemostasis was then obtained bipolar cautery.  Xeroform dressing and sterile compressive bandage were applied.  The patient tolerated the procedure well and was returned to recovery room in good condition.  POSTPROCEDURE PLAN:  The patient discharged home.  See me back in the office in 10 days for wound check, suture removal, Gel Flex glove, and lifting restrictions, and go over the postoperative carpal tunnel eval and treat.     Ricky Hawkins, M.D.   ______________________________ Ricky Hawkins, M.D.    FWO/MEDQ  D:  02/24/2016  T:  02/24/2016  Job:  [id number]

## 2016-02-28 NOTE — Anesthesia Postprocedure Evaluation (Signed)
Anesthesia Post Note  Patient: Ricky Hawkins  Procedure(Hawkins) Performed: Procedure(Hawkins) (LRB): CARPAL TUNNEL RELEASE (Right)  Patient location during evaluation: PACU Anesthesia Type: MAC Level of consciousness: awake and alert Pain management: pain level controlled Vital Signs Assessment: post-procedure vital signs reviewed and stable Respiratory status: spontaneous breathing, nonlabored ventilation, respiratory function stable and patient connected to nasal cannula oxygen Cardiovascular status: stable and blood pressure returned to baseline Anesthetic complications: no       Last Vitals:  Vitals:   02/24/16 1337 02/24/16 1345  BP: 117/89 122/86  Pulse: (!) 52 (!) 59  Resp: 18 (!) 24  Temp: 36.1 C 36.1 C    Last Pain:  Vitals:   02/24/16 1345  TempSrc:   PainSc: 0-No pain                 Ricky Hawkins

## 2016-03-09 DIAGNOSIS — G5601 Carpal tunnel syndrome, right upper limb: Secondary | ICD-10-CM | POA: Diagnosis not present

## 2016-03-13 MED FILL — TESTOSTERONE 10 MG GEL PUMP: 10 MG/ACT | 40 days supply | Qty: 120 | Fill #0

## 2016-06-30 DIAGNOSIS — H52223 Regular astigmatism, bilateral: Secondary | ICD-10-CM | POA: Diagnosis not present

## 2016-06-30 DIAGNOSIS — H524 Presbyopia: Secondary | ICD-10-CM | POA: Diagnosis not present

## 2016-06-30 DIAGNOSIS — H5213 Myopia, bilateral: Secondary | ICD-10-CM | POA: Diagnosis not present

## 2016-07-11 ENCOUNTER — Telehealth: Payer: Self-pay | Admitting: *Deleted

## 2016-07-11 DIAGNOSIS — R079 Chest pain, unspecified: Secondary | ICD-10-CM

## 2016-07-11 NOTE — Telephone Encounter (Signed)
-----   Message from Minus Breeding, MD sent at 07/11/2016  9:07 AM EDT ----- Can we call this patient and schedule a coronary calcium?  Thanks.

## 2016-07-11 NOTE — Telephone Encounter (Signed)
Coronary calcium ordered and send to schedule to be schedule

## 2016-07-13 ENCOUNTER — Telehealth: Payer: Self-pay | Admitting: Cardiology

## 2016-07-13 MED FILL — TESTOSTERONE 10 MG GEL PUMP: 10 MG/ACT | 40 days supply | Qty: 120 | Fill #1

## 2016-07-13 NOTE — Telephone Encounter (Signed)
Called patient with appointment date and time for coronary calcium score test at [facility].  If he can't keep this appointment I left the number for him to call and reschedule this appointment.

## 2016-07-19 ENCOUNTER — Inpatient Hospital Stay: Admission: RE | Admit: 2016-07-19 | Payer: BLUE CROSS/BLUE SHIELD | Source: Ambulatory Visit

## 2016-08-08 ENCOUNTER — Ambulatory Visit (INDEPENDENT_AMBULATORY_CARE_PROVIDER_SITE_OTHER)
Admission: RE | Admit: 2016-08-08 | Discharge: 2016-08-08 | Disposition: A | Discharge status: Home / Self Care | Payer: Self-pay | Source: Ambulatory Visit | Attending: Cardiology | Admitting: Cardiology

## 2016-08-08 DIAGNOSIS — R079 Chest pain, unspecified: Secondary | ICD-10-CM

## 2016-08-22 ENCOUNTER — Telehealth: Payer: Self-pay | Admitting: Cardiology

## 2016-08-22 NOTE — Telephone Encounter (Signed)
New message     Pt is calling to get his CT results its has been 2 weeks since he had it done

## 2016-08-22 NOTE — Telephone Encounter (Signed)
Pt aware of Ca score of 0.

## 2016-09-15 ENCOUNTER — Ambulatory Visit: Payer: 59

## 2016-09-18 ENCOUNTER — Ambulatory Visit (INDEPENDENT_AMBULATORY_CARE_PROVIDER_SITE_OTHER): Payer: 59 | Admitting: *Deleted

## 2016-09-18 DIAGNOSIS — Z23 Encounter for immunization: Secondary | ICD-10-CM | POA: Diagnosis not present

## 2016-10-27 DIAGNOSIS — M65311 Trigger thumb, right thumb: Secondary | ICD-10-CM | POA: Diagnosis not present

## 2016-11-01 DIAGNOSIS — Z125 Encounter for screening for malignant neoplasm of prostate: Secondary | ICD-10-CM | POA: Diagnosis not present

## 2016-11-01 DIAGNOSIS — Z0000 Encounter for general adult medical examination without abnormal findings: Secondary | ICD-10-CM | POA: Diagnosis not present

## 2016-11-02 DIAGNOSIS — L309 Dermatitis, unspecified: Secondary | ICD-10-CM | POA: Diagnosis not present

## 2016-11-02 DIAGNOSIS — C44311 Basal cell carcinoma of skin of nose: Secondary | ICD-10-CM | POA: Diagnosis not present

## 2016-11-02 DIAGNOSIS — D485 Neoplasm of uncertain behavior of skin: Secondary | ICD-10-CM | POA: Diagnosis not present

## 2016-11-10 DIAGNOSIS — Z0000 Encounter for general adult medical examination without abnormal findings: Secondary | ICD-10-CM | POA: Diagnosis not present

## 2016-11-10 DIAGNOSIS — E871 Hypo-osmolality and hyponatremia: Secondary | ICD-10-CM | POA: Diagnosis not present

## 2016-11-10 DIAGNOSIS — D72819 Decreased white blood cell count, unspecified: Secondary | ICD-10-CM | POA: Diagnosis not present

## 2016-11-10 DIAGNOSIS — E668 Other obesity: Secondary | ICD-10-CM | POA: Diagnosis not present

## 2016-11-10 DIAGNOSIS — E298 Other testicular dysfunction: Secondary | ICD-10-CM | POA: Diagnosis not present

## 2016-11-10 DIAGNOSIS — K219 Gastro-esophageal reflux disease without esophagitis: Secondary | ICD-10-CM | POA: Diagnosis not present

## 2016-11-10 DIAGNOSIS — Z1389 Encounter for screening for other disorder: Secondary | ICD-10-CM | POA: Diagnosis not present

## 2016-11-10 DIAGNOSIS — Z6829 Body mass index (BMI) 29.0-29.9, adult: Secondary | ICD-10-CM | POA: Diagnosis not present

## 2016-11-10 DIAGNOSIS — F900 Attention-deficit hyperactivity disorder, predominantly inattentive type: Secondary | ICD-10-CM | POA: Diagnosis not present

## 2016-11-15 ENCOUNTER — Other Ambulatory Visit (INDEPENDENT_AMBULATORY_CARE_PROVIDER_SITE_OTHER): Payer: 59

## 2016-11-15 DIAGNOSIS — Z111 Encounter for screening for respiratory tuberculosis: Secondary | ICD-10-CM

## 2016-11-15 NOTE — Progress Notes (Signed)
PPD placed Rt foremarm

## 2016-11-17 DIAGNOSIS — Z1212 Encounter for screening for malignant neoplasm of rectum: Secondary | ICD-10-CM | POA: Diagnosis not present

## 2016-11-20 ENCOUNTER — Encounter: Payer: Self-pay | Admitting: *Deleted

## 2016-11-20 LAB — TB SKIN TEST: TB SKIN TEST: NEGATIVE

## 2016-12-13 DIAGNOSIS — C44311 Basal cell carcinoma of skin of nose: Secondary | ICD-10-CM | POA: Diagnosis not present

## 2016-12-13 DIAGNOSIS — Z85828 Personal history of other malignant neoplasm of skin: Secondary | ICD-10-CM | POA: Diagnosis not present

## 2016-12-13 MED FILL — DOXYCYCLINE HYCLATE 100 MG: 100 | 5 days supply | Qty: 10 | Fill #0

## 2017-01-01 MED FILL — TESTOSTERONE 10 MG GEL PUMP: 10 MG/ACT | 40 days supply | Qty: 120 | Fill #0

## 2017-05-22 MED FILL — CLINDAMYCIN PHOSP 1% LOTION: 1 | 30 days supply | Qty: 60 | Fill #0

## 2017-05-22 MED FILL — DOXYCYCLINE MONO 100 MG TAB: 100 | 30 days supply | Qty: 30 | Fill #0

## 2017-07-02 ENCOUNTER — Encounter: Payer: No Typology Code available for payment source | Attending: Internal Medicine | Admitting: Registered"

## 2017-07-02 DIAGNOSIS — Z713 Dietary counseling and surveillance: Secondary | ICD-10-CM | POA: Insufficient documentation

## 2017-07-02 NOTE — Patient Instructions (Addendum)
Aim to take at least 20 minutes to eat your meals Consider getting more whole grain, especially for breakfast to help with energy. Continue getting grilled over fried foods. Continue with your plan to go to the gym 2-3x week 60-75 min.

## 2017-07-02 NOTE — Progress Notes (Signed)
Medical Nutrition Therapy:  Appt start time: 2297 end time:  1140.  [employer] Employee (Spouse) visit 1 of 3  Assessment:  Purpose of patient's visit is for insurance requirements due to BMI. Pt states he recently lost 16 lb due to 2 weeks of hiking in [place] with a youth group. Patient states he takes aleve daily for knee pain. Pt states he didn't have pain while hiking. Pt states he stopped mountain biking due to carpal tunnel issues in both hands.  Pt states he and his wife have tried several different diets over the years. Pt states the last diet was the Whole 30 and lost weight but felt he was starving himself has since gained the weight back. Patient states he is a fast eater, hold-over behavior from childhood. Pt states he feels he probably eats larger portions than he needs to because of this eating behavior.  Sleep: 6-7 hrs Can't go to sleep before 11. Watches TV before bed. Pt states he was tested for sleep apnea, but was told if he has it, it is mild.  Pt states he is trying to get up earlier because he wants to get up to go to gym, if he wait until later in the day it likely does not happen.  Preferred Learning Style:   No preference indicated   Learning Readiness:   Ready  MEDICATIONS: reviewed   DIETARY INTAKE:  24-hr recall:  B ( AM): 2 eggs, veggies, cooked in olive oil, [place] sausage, 2-3 c coffee fat free creamer (last 2 weeks black coffee), sm glass of OJ Snk ( AM): nuts OR banana OR grapefruit  L ( PM): jimmy johns OR Zaxbys, OR whole foods hot bar Snk ( PM): fruit, nuts, Terra chips D ( PM): salmon, broccoli, green beans OR steak 2-3x month OR Chinese steamed beef & broccoli, brown rice Snk ( PM): none Beverages: coffee, water, OJ, 4 servings wine   Usual physical activity: ADLs, sometimes gym 45 min on treadmill, limited by partially torn meniscus.   Estimated energy needs: 2300 calories  Progress Towards Goal(s):  New goal.   Nutritional Diagnosis:   NI-5.8.4 Inconsistent carbohydrate intake As related to avoiding carbs at some meals, left over behavior from Whole 30 diet.  As evidenced by dietary recall and pt stated eating patterns.    Intervention:  Nutrition Education. Discussed role of each macronutrient. Discussed realistic expectations from dieting. Discussed importance of sleep and exercise and relation to food choices.  Plan: Aim to take at least 20 minutes to eat your meals Consider getting more whole grain, especially for breakfast to help with energy. Continue getting grilled over fried foods. Continue with your plan to go to the gym 2-3x week 60-75 min.  Teaching Method Utilized:  Visual Auditory   Handouts given during visit include:  MyPlate Planner  Sleep hygiene  Barriers to learning/adherence to lifestyle change: none  Demonstrated degree of understanding via:  Teach Back   Monitoring/Evaluation:  Dietary intake, exercise, sleep habits, and body weight in 1 month(s).

## 2017-07-09 ENCOUNTER — Encounter: Payer: No Typology Code available for payment source | Admitting: Registered"

## 2017-07-09 DIAGNOSIS — Z713 Dietary counseling and surveillance: Secondary | ICD-10-CM

## 2017-07-09 NOTE — Patient Instructions (Addendum)
Consider going to bed by 10 pm so you can get up early in the morning to exercise Basic recommended physical activity 3-5x per week, 150 min Continue with your plan to go to the gym 2-3x week 60-75 min. Continue to have 3 balanced meals per day Aim to take at least 20 minutes to eat your meals Continue having whole grains, choosing grilled over fried food Consider taking a fish oil ~1000 mg

## 2017-07-09 NOTE — Progress Notes (Signed)
Medical Nutrition Therapy:  Appt start time: 1610 end time:  1640.  [employer] (Spouse) visit 2 of 3  Assessment:  Pt states his knee pain continues to limit types of physical activity and required regular cortisone shots. Pt states he still has a goal of waking up earlier so he can make it to the gym. Pt seems a little more open this time to trying to go to sleep by 10 pm even though he has a life-long habit of staying up to at least 11 pm. Pt states sometimes it is hard to sleep when there is still activity going on in the house and in the neighborhood.  Pt states he will continue to try to slow down eating. From today's 24-hr patient may be getting the daily recommended amount of fruits and vegetables.   Preferred Learning Style:   No preference indicated   Learning Readiness:   Ready  MEDICATIONS: reviewed   DIETARY INTAKE:  24-hr recall:  B ( AM): 2 sausage, 1 egg, OJ, shrimp Snk ( AM): cantelope L ( PM): [place] sandwich, celery, unsweet tea Snk ( PM): shrimp D ( PM): fish tacos, avocado, shredded cabbage, corn tortilla, broccolini Snk ( PM): none Beverages: coffee, water, OJ, 4 servings wine   Usual physical activity: ADLs, sometimes gym 45 min on treadmill, limited by partially torn meniscus.   Estimated energy needs: 2300 calories  Progress Towards Goal(s):  New goal.   Nutritional Diagnosis:  NI-5.8.4 Inconsistent carbohydrate intake As related to avoiding carbs at some meals, left over behavior from Whole 30 diet.  As evidenced by dietary recall and pt stated eating patterns.    Intervention:  Nutrition Education. Discussed role of each macronutrient. Discussed realistic expectations from dieting. Discussed importance of sleep and exercise and relation to food choices.  Plan: Consider going to bed by 10 pm so you can get up early in the morning to exercise Basic recommended physical activity 3-5x per week, 150 min Continue with your plan to go to the gym 2-3x  week 60-75 min. Continue to have 3 balanced meals per day Aim to take at least 20 minutes to eat your meals Continue having whole grains, choosing grilled over fried food Consider taking a fish oil ~1000 mg  Teaching Method Utilized:  Visual Auditory   Handouts given during visit include:  Sleep hygiene  Barriers to learning/adherence to lifestyle change: none  Demonstrated degree of understanding via:  Teach Back   Monitoring/Evaluation:  Dietary intake, exercise, sleep habits, and body weight in 1 month(s).

## 2017-07-10 ENCOUNTER — Encounter: Payer: Self-pay | Admitting: Registered"

## 2017-07-23 ENCOUNTER — Ambulatory Visit: Payer: No Typology Code available for payment source | Admitting: Registered"

## 2017-07-26 ENCOUNTER — Encounter: Payer: No Typology Code available for payment source | Admitting: Registered"

## 2017-07-26 DIAGNOSIS — Z713 Dietary counseling and surveillance: Secondary | ICD-10-CM

## 2017-07-26 NOTE — Progress Notes (Signed)
Medical Nutrition Therapy:  Appt start time: 1520 end time:  1550.  [employer] (Spouse) visit 3 of 3  Assessment:  Pt states he is trying to get more sleep but still having a hard time cutting off the TV at a decent hour. Pt states he also finds that is probably snacking more than he should during the day when he is working from home. Pt states days when working and getting a snack means stopping, doesn't usually make the extra effort.   Pt states he still wants to get his workout done in the morning. Pt states yesterday he got up at 5, but today woke up at 6 and got on his computer to work instead of going to the gym.  Patient states he and his wife are still shying away from grains and bread. Pt states he probably could use more energy from diet and will consider adding more carbs.  Preferred Learning Style:   No preference indicated   Learning Readiness:   Ready  MEDICATIONS: reviewed   DIETARY INTAKE:  24-hr recall:  B ( AM): 2 egg, grapefruit, 3 c coffee Snk ( AM): veggie straws L ( PM): left crab cake Snk ( PM): veggie straws, banana D ( PM): crab cakes, vegetables, salad Snk ( PM): none Beverages: coffee, water, OJ, 4 servings wine   Usual physical activity: ADLs, sometimes gym 45 min on treadmill  Estimated energy needs: 2300 calories  Progress Towards Goal(s):  New goal.   Nutritional Diagnosis:  NI-5.8.4 Inconsistent carbohydrate intake As related to avoiding carbs at some meals, left over behavior from Whole 30 diet.  As evidenced by dietary recall and pt stated eating patterns.    Intervention:  Nutrition Education. Discussed the process of creating new habits and investigating our motivations for doing what we do. Discussed energy supplied by carbohydrates.  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 Aim to be asleep by 10:30 pm. Think of ways to get the morning routine of working out  Teaching Method Utilized:   Ship broker   Handouts given during visit include:  none  Barriers to learning/adherence to lifestyle change: none  Demonstrated degree of understanding via:  Teach Back   Monitoring/Evaluation:  Dietary intake, exercise, sleep habits, and body weight prn.

## 2017-07-26 NOTE — Patient Instructions (Signed)
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 Aim to be asleep by 10:30 pm. Think of ways to get the morning routine of working out

## 2017-10-11 MED FILL — levoFLOXacin 500 MG TABS: 500 | 7 days supply | Qty: 7 | Fill #0

## 2017-10-26 ENCOUNTER — Other Ambulatory Visit: Payer: Self-pay

## 2017-10-26 ENCOUNTER — Encounter (HOSPITAL_BASED_OUTPATIENT_CLINIC_OR_DEPARTMENT_OTHER): Payer: Self-pay | Admitting: *Deleted

## 2017-10-26 NOTE — Progress Notes (Signed)
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

## 2017-10-30 ENCOUNTER — Encounter (HOSPITAL_BASED_OUTPATIENT_CLINIC_OR_DEPARTMENT_OTHER): Payer: Self-pay | Admitting: *Deleted

## 2017-10-31 NOTE — Pre-Procedure Instructions (Signed)
Spoke with Judeen Hammans, Dr. Alvan Dame is planning to keep surgery scheduled for tomorrow and have anesthesia evaluate day of surgery.  Dr. Lanetta Inch already gone for the day informed Dr. Roanna Banning, we have documentation that the surgery should be delayed until CXR is normal.  Dr. Lanetta Inch had already stated she will abide by the previous recommendation per Dr. Osborne Casco, no new orders received at this time.  Patient will arrive tomorrow and be evaluated by anesthesia.

## 2017-10-31 NOTE — Pre-Procedure Instructions (Signed)
Dr. Levy Sjogren made aware of recommendation from the radiology report minimal RUL infiltrate. Will need to delay surgery.  Dr. Lanetta Inch stated she will not go against Dr. Osborne Casco recommendation and surgery would need to be rescheduled.  Sherry at Dr. Aurea Graff office has been made aware and will notify Dr. Alvan Dame.

## 2017-10-31 NOTE — Pre-Procedure Instructions (Signed)
Left a message for Ricky Hawkins at Dr. Aurea Graff office to follow up on CXR for Ricky Hawkins.  He mentioned having a follow up CXR after his pnuemonia.  I waanted the results faxed to [facility]. I also spoke with Ricky Hawkins he will also reach out to the radiologist who performed CXR and ask them to fax me a copy of the report.

## 2017-10-31 NOTE — Pre-Procedure Instructions (Signed)
Received faxed radiology report from [facility].  Impressions: persisent minimal right upper lobe infiltrate. Recommend followup chest radiograph in 4-6 weeks. Mild hyperexpansion. Still has minimal RUL infiltrate. Will need to delay his surgery.  This information was faxed to [facility] at 336 [phone] and to Dr. Alvan Dame [phone].  I also left a voicemail for Judeen Hammans to inform them of the radiology report and that I had sent them the faxed copy.

## 2017-11-01 ENCOUNTER — Other Ambulatory Visit: Payer: Self-pay

## 2017-11-01 ENCOUNTER — Ambulatory Visit (HOSPITAL_BASED_OUTPATIENT_CLINIC_OR_DEPARTMENT_OTHER): Payer: No Typology Code available for payment source | Admitting: Anesthesiology

## 2017-11-01 ENCOUNTER — Ambulatory Visit (HOSPITAL_BASED_OUTPATIENT_CLINIC_OR_DEPARTMENT_OTHER)
Admission: RE | Admit: 2017-11-01 | Discharge: 2017-11-01 | Disposition: A | Discharge status: Home / Self Care | Payer: No Typology Code available for payment source | Source: Ambulatory Visit | Attending: Orthopedic Surgery | Admitting: Orthopedic Surgery

## 2017-11-01 ENCOUNTER — Encounter (HOSPITAL_BASED_OUTPATIENT_CLINIC_OR_DEPARTMENT_OTHER): Payer: Self-pay

## 2017-11-01 ENCOUNTER — Encounter (HOSPITAL_BASED_OUTPATIENT_CLINIC_OR_DEPARTMENT_OTHER)
Admission: RE | Disposition: A | Discharge status: Home / Self Care | Payer: Self-pay | Source: Ambulatory Visit | Attending: Orthopedic Surgery

## 2017-11-01 ENCOUNTER — Ambulatory Visit (HOSPITAL_COMMUNITY): Payer: No Typology Code available for payment source

## 2017-11-01 DIAGNOSIS — X58XXXA Exposure to other specified factors, initial encounter: Secondary | ICD-10-CM | POA: Insufficient documentation

## 2017-11-01 DIAGNOSIS — Z01818 Encounter for other preprocedural examination: Secondary | ICD-10-CM

## 2017-11-01 DIAGNOSIS — G4730 Sleep apnea, unspecified: Secondary | ICD-10-CM | POA: Insufficient documentation

## 2017-11-01 DIAGNOSIS — S83231A Complex tear of medial meniscus, current injury, right knee, initial encounter: Secondary | ICD-10-CM | POA: Insufficient documentation

## 2017-11-01 DIAGNOSIS — M2241 Chondromalacia patellae, right knee: Secondary | ICD-10-CM | POA: Insufficient documentation

## 2017-11-01 HISTORY — PX: KNEE ARTHROSCOPY WITH MEDIAL MENISECTOMY: SHX5651

## 2017-11-01 HISTORY — DX: Pneumonia, unspecified organism: J18.9

## 2017-11-01 HISTORY — DX: Other tear of lateral meniscus, current injury, right knee, initial encounter: S83.281A

## 2017-11-01 SURGERY — ARTHROSCOPY, KNEE, WITH MEDIAL MENISCECTOMY
Anesthesia: General | Laterality: Right

## 2017-11-01 MED ORDER — FENTANYL CITRATE (PF) 100 MCG/2ML IJ SOLN
INTRAMUSCULAR | Status: AC
  Filled 2017-11-01: qty 2

## 2017-11-01 MED ORDER — LIDOCAINE 2% (20 MG/ML) 5 ML SYRINGE
INTRAMUSCULAR | Status: DC | PRN
  Administered 2017-11-01: 40 mg via INTRAVENOUS

## 2017-11-01 MED ORDER — HYDROMORPHONE HCL 1 MG/ML IJ SOLN
0.2500 mg | INTRAMUSCULAR | Status: DC | PRN
  Administered 2017-11-01 (×2): 0.5 mg via INTRAVENOUS
  Filled 2017-11-01: qty 0.5

## 2017-11-01 MED ORDER — CHLORHEXIDINE GLUCONATE 4 % EX LIQD
60.0000 mL | Freq: Once | CUTANEOUS | Status: DC
  Filled 2017-11-01: qty 118

## 2017-11-01 MED ORDER — PROPOFOL 10 MG/ML IV BOLUS
INTRAVENOUS | Status: AC
  Filled 2017-11-01: qty 20

## 2017-11-01 MED ORDER — CEFAZOLIN SODIUM-DEXTROSE 2-4 GM/100ML-% IV SOLN
INTRAVENOUS | Status: AC
  Filled 2017-11-01: qty 100

## 2017-11-01 MED ORDER — MIDAZOLAM HCL 2 MG/2ML IJ SOLN
INTRAMUSCULAR | Status: DC | PRN
  Administered 2017-11-01: 2 mg via INTRAVENOUS

## 2017-11-01 MED ORDER — SCOPOLAMINE 1 MG/3DAYS TD PT72
MEDICATED_PATCH | TRANSDERMAL | Status: AC
  Filled 2017-11-01: qty 1

## 2017-11-01 MED ORDER — PROMETHAZINE HCL 25 MG/ML IJ SOLN
6.2500 mg | INTRAMUSCULAR | Status: DC | PRN
  Filled 2017-11-01: qty 1

## 2017-11-01 MED ORDER — CEFAZOLIN SODIUM-DEXTROSE 2-4 GM/100ML-% IV SOLN
2.0000 g | INTRAVENOUS | Status: AC
  Administered 2017-11-01: 2 g via INTRAVENOUS
  Filled 2017-11-01: qty 100

## 2017-11-01 MED ORDER — HYDROCODONE-ACETAMINOPHEN 5-325 MG PO TABS: 1.0000 | ORAL_TABLET | Freq: Four times a day (QID) | ORAL | 0 refills | Status: DC | PRN

## 2017-11-01 MED ORDER — KETOROLAC TROMETHAMINE 30 MG/ML IJ SOLN
INTRAMUSCULAR | Status: AC
  Filled 2017-11-01: qty 1

## 2017-11-01 MED ORDER — DEXAMETHASONE SODIUM PHOSPHATE 10 MG/ML IJ SOLN
INTRAMUSCULAR | Status: AC
  Filled 2017-11-01: qty 1

## 2017-11-01 MED ORDER — LIDOCAINE 2% (20 MG/ML) 5 ML SYRINGE
INTRAMUSCULAR | Status: AC
  Filled 2017-11-01: qty 5

## 2017-11-01 MED ORDER — SCOPOLAMINE 1 MG/3DAYS TD PT72
1.0000 | MEDICATED_PATCH | Freq: Once | TRANSDERMAL | Status: DC
  Administered 2017-11-01: 1.5 mg via TRANSDERMAL
  Filled 2017-11-01: qty 1

## 2017-11-01 MED ORDER — MIDAZOLAM HCL 2 MG/2ML IJ SOLN
0.5000 mg | Freq: Once | INTRAMUSCULAR | Status: DC | PRN
  Filled 2017-11-01: qty 2

## 2017-11-01 MED ORDER — MELOXICAM 15 MG PO TABS: 15.0000 mg | ORAL_TABLET | Freq: Two times a day (BID) | ORAL | 0 refills | Status: AC

## 2017-11-01 MED ORDER — ASPIRIN EC 325 MG PO TBEC: 325.0000 mg | DELAYED_RELEASE_TABLET | Freq: Every day | ORAL | 0 refills | Status: AC

## 2017-11-01 MED ORDER — DEXAMETHASONE SODIUM PHOSPHATE 10 MG/ML IJ SOLN
10.0000 mg | Freq: Once | INTRAMUSCULAR | Status: AC
  Administered 2017-11-01: 10 mg via INTRAVENOUS
  Filled 2017-11-01: qty 1

## 2017-11-01 MED ORDER — FENTANYL CITRATE (PF) 100 MCG/2ML IJ SOLN
INTRAMUSCULAR | Status: DC | PRN
  Administered 2017-11-01: 100 ug via INTRAVENOUS

## 2017-11-01 MED ORDER — ONDANSETRON HCL 4 MG/2ML IJ SOLN
INTRAMUSCULAR | Status: AC
  Filled 2017-11-01: qty 2

## 2017-11-01 MED ORDER — LIDOCAINE-EPINEPHRINE 1 %-1:100000 IJ SOLN
INTRAMUSCULAR | Status: DC | PRN
  Administered 2017-11-01: 30 mL

## 2017-11-01 MED ORDER — KETOROLAC TROMETHAMINE 30 MG/ML IJ SOLN
INTRAMUSCULAR | Status: DC | PRN
  Administered 2017-11-01: 30 mg via INTRAVENOUS

## 2017-11-01 MED ORDER — MEPERIDINE HCL 25 MG/ML IJ SOLN
6.2500 mg | INTRAMUSCULAR | Status: DC | PRN
  Filled 2017-11-01: qty 1

## 2017-11-01 MED ORDER — MIDAZOLAM HCL 2 MG/2ML IJ SOLN
INTRAMUSCULAR | Status: AC
  Filled 2017-11-01: qty 2

## 2017-11-01 MED ORDER — LACTATED RINGERS IV SOLN
INTRAVENOUS | Status: DC
  Administered 2017-11-01: 14:00:00 via INTRAVENOUS
  Administered 2017-11-01: 1000 mL via INTRAVENOUS
  Filled 2017-11-01: qty 1000

## 2017-11-01 MED ORDER — HYDROMORPHONE HCL 1 MG/ML IJ SOLN
INTRAMUSCULAR | Status: AC
  Filled 2017-11-01: qty 1

## 2017-11-01 MED ORDER — BUPIVACAINE-EPINEPHRINE 0.25% -1:200000 IJ SOLN
INTRAMUSCULAR | Status: DC | PRN
  Administered 2017-11-01: 20 mL

## 2017-11-01 MED ORDER — ONDANSETRON HCL 4 MG/2ML IJ SOLN
INTRAMUSCULAR | Status: DC | PRN
  Administered 2017-11-01: 4 mg via INTRAVENOUS

## 2017-11-01 MED ORDER — PROPOFOL 10 MG/ML IV BOLUS
INTRAVENOUS | Status: DC | PRN
  Administered 2017-11-01: 200 mg via INTRAVENOUS
  Administered 2017-11-01: 150 mg via INTRAVENOUS

## 2017-11-01 MED FILL — MELOXICAM 15 MG TABLET: 15 | 30 days supply | Qty: 60 | Fill #0

## 2017-11-01 MED FILL — HYDROCODON-APAP 5-325: 5-325 | 5 days supply | Qty: 40 | Fill #0

## 2017-11-01 SURGICAL SUPPLY — 30 items
BANDAGE ACE 6X5 VEL STRL LF (GAUZE/BANDAGES/DRESSINGS) IMPLANT
BANDAGE ELASTIC 6 VELCRO ST LF (GAUZE/BANDAGES/DRESSINGS) ×3 IMPLANT
BLADE 4.2CUDA (BLADE) IMPLANT
BLADE CUDA SHAVER 3.5 (BLADE) ×3 IMPLANT
COVER WAND RF STERILE (DRAPES) ×3 IMPLANT
DRAPE ARTHROSCOPY W/POUCH 114 (DRAPES) ×3 IMPLANT
DRAPE U-SHAPE 47X51 STRL (DRAPES) ×3 IMPLANT
DRSG EMULSION OIL 3X3 NADH (GAUZE/BANDAGES/DRESSINGS) ×3 IMPLANT
DURAPREP 26ML APPLICATOR (WOUND CARE) ×3 IMPLANT
ELECT MENISCUS 165MM 90D (ELECTRODE) IMPLANT
GAUZE SPONGE 4X4 12PLY STRL (GAUZE/BANDAGES/DRESSINGS) ×3 IMPLANT
GLOVE BIO SURGEON STRL SZ7.5 (GLOVE) ×3 IMPLANT
GLOVE BIOGEL PI IND STRL 7.5 (GLOVE) ×1 IMPLANT
GLOVE BIOGEL PI INDICATOR 7.5 (GLOVE) ×2
GOWN STRL REUS W/TWL LRG LVL3 (GOWN DISPOSABLE) ×3 IMPLANT
KIT TURNOVER CYSTO (KITS) ×3 IMPLANT
KNEE WRAP E Z 3 GEL PACK (MISCELLANEOUS) ×3 IMPLANT
MANIFOLD NEPTUNE II (INSTRUMENTS) ×3 IMPLANT
PACK ARTHROSCOPY DSU (CUSTOM PROCEDURE TRAY) ×3 IMPLANT
PACK BASIN DAY SURGERY FS (CUSTOM PROCEDURE TRAY) ×3 IMPLANT
PADDING CAST COTTON 6X4 STRL (CAST SUPPLIES) ×3 IMPLANT
PROBE BIPOLAR ATHRO 135MM 90D (MISCELLANEOUS) IMPLANT
SET ARTHROSCOPY TUBING (MISCELLANEOUS) ×3
SET ARTHROSCOPY TUBING LN (MISCELLANEOUS) ×1 IMPLANT
SUT ETHILON 4 0 PS 2 18 (SUTURE) ×3 IMPLANT
SYR 30ML LL (SYRINGE) ×3 IMPLANT
TOWEL OR 17X24 6PK STRL BLUE (TOWEL DISPOSABLE) ×3 IMPLANT
TUBE CONNECTING 12'X1/4 (SUCTIONS) ×1
TUBE CONNECTING 12X1/4 (SUCTIONS) ×2 IMPLANT
WATER STERILE IRR 500ML POUR (IV SOLUTION) ×3 IMPLANT

## 2017-11-01 NOTE — Anesthesia Preprocedure Evaluation (Addendum)
Anesthesia Evaluation  Patient identified by MRN, date of birth, ID band Patient awake    Reviewed: Allergy & Precautions, NPO status , Patient's Chart, lab work & pertinent test results  History of Anesthesia Complications Negative for: history of anesthetic complications  Airway Mallampati: I  TM Distance: >3 FB Neck ROM: Full    Dental  (+) Dental Advisory Given, Teeth Intact   Pulmonary sleep apnea (mild, does not require CPAP) , pneumonia, resolved, Recent URI , Resolved,    breath sounds clear to auscultation       Cardiovascular negative cardio ROS   Rhythm:Regular Rate:Normal     Neuro/Psych negative neurological ROS     GI/Hepatic negative GI ROS, Neg liver ROS,   Endo/Other  negative endocrine ROS  Renal/GU negative Renal ROS     Musculoskeletal negative musculoskeletal ROS (+)   Abdominal   Peds  Hematology negative hematology ROS (+)   Anesthesia Other Findings   Reproductive/Obstetrics                           Anesthesia Physical Anesthesia Plan  ASA: II  Anesthesia Plan: General   Post-op Pain Management:    Induction: Intravenous  PONV Risk Score and Plan: 2 and Ondansetron, Dexamethasone and Scopolamine patch - Pre-op  Airway Management Planned: LMA  Additional Equipment:   Intra-op Plan:   Post-operative Plan:   Informed Consent: I have reviewed the patients History and Physical, chart, labs and discussed the procedure including the risks, benefits and alternatives for the proposed anesthesia with the patient or authorized representative who has indicated his/her understanding and acceptance.   Dental advisory given  Plan Discussed with: CRNA and Surgeon  Anesthesia Plan Comments: (Plan routine monitors, GA- LMA OK)        Anesthesia Quick Evaluation

## 2017-11-01 NOTE — H&P (Signed)
CC- Ricky Hawkins is a 47 y.o. male who presents with right knee pain.  HPI- . Knee Pain: Patient presents for follow up on a knee problem involving the  right knee. Onset of the symptoms was several months ago. Inciting event: none known. Current symptoms include giving out, locking, pain located medially, stiffness and swelling. Pain is aggravated by going up and down stairs, lateral movements, rising after sitting and squatting.  Patient has had no prior knee problems. Evaluation to date: plain films: normal and MRI: abnormal with medial meniscus tear and patellofemoral chondromalacia. Treatment to date: avoidance of offending activity, brace which is not very effective, corticosteroid injection which was somewhat effective, OTC analgesics which are somewhat effective, prescription NSAIDS which are somewhat effective and rest.  Past Medical History:  Diagnosis Date  . Acute lateral meniscus tear of right knee    RIGHT KNEE  . Pneumonia    DX 2 WEEKS AGO COMPLETED 7 DAYS LEVAQUIN DOING CHEST XRAY MONDAY OR TUESDAY 10-28 OR 10-28    Past Surgical History:  Procedure Laterality Date  . CARPAL TUNNEL RELEASE Right 02/24/2016   Procedure: CARPAL TUNNEL RELEASE;  Surgeon: Iran Planas, MD;  Location: [facility];  Service: Orthopedics;  Laterality: Right;  . FRACTURE SURGERY  2014   RIGHT METARAPAL FX   . HAND SURGERY Left 2017   CARPAL TUNNEL RELEASE    Prior to Admission medications   Medication Sig Start Date End Date Taking? Authorizing Provider  fluticasone (FLONASE) 50 MCG/ACT nasal spray Place into both nostrils daily as needed for allergies or rhinitis.   Yes [provider]  ibuprofen (ADVIL,MOTRIN) 400 MG tablet Take 400 mg by mouth 2 (two) times daily.   Yes [provider]    soft tissue tenderness over the medial joint line and slightly around patella, effusion, reduced range of motion, collateral ligaments intact, normal ipsilateral hip exam, normal  ipsilateral foot and ankle exam, normal contralateral knee exam  Physical Examination: General appearance - alert, well appearing, and in no distress Mental status - alert, oriented to person, place, and time Chest - no tachypnea, retractions or cyanosis Heart - normal rate and regular rhythm Abdomen - soft, nontender, nondistended, no masses or organomegaly Neurological - alert, oriented, normal speech, no focal findings or movement disorder noted Musculoskeletal - see above knee exam Extremities - peripheral pulses normal, no pedal edema, no clubbing or cyanosis Skin - normal coloration and turgor, no rashes, no suspicious skin lesions noted   Asessment/Plan--- 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 Procedure risks and potential comps discussed with patient who elects to proceed. Goals are decreased pain and increased function with a high likelihood of achieving both

## 2017-11-01 NOTE — Discharge Instructions (Signed)

## 2017-11-01 NOTE — Anesthesia Procedure Notes (Signed)
Procedure Name: LMA Insertion Date/Time: 11/01/2017 12:28 PM Performed by: Wanita Chamberlain, CRNA Pre-anesthesia Checklist: Patient identified, Timeout performed, Emergency Drugs available, Suction available and Patient being monitored Patient Re-evaluated:Patient Re-evaluated prior to induction Oxygen Delivery Method: Circle system utilized Preoxygenation: Pre-oxygenation with 100% oxygen Induction Type: IV induction Ventilation: Mask ventilation without difficulty LMA: LMA inserted LMA Size: 5.0 Number of attempts: 1 Placement Confirmation: CO2 detector,  positive ETCO2 and breath sounds checked- equal and bilateral Tube secured with: Tape Dental Injury: Teeth and Oropharynx as per pre-operative assessment

## 2017-11-01 NOTE — Brief Op Note (Signed)
11/01/2017  1:05 PM  PATIENT:  Ricky Hawkins  47 y.o. male  PRE-OPERATIVE DIAGNOSIS:  right knee medial meniscus tear, patellofemoral medial chondromalacia  POST-OPERATIVE DIAGNOSIS:  right knee medial meniscus tear, grade II-III patellofemoral, medial chondromalacia   PROCEDURE:  Procedure(s) with comments: KNEE ARTHROSCOPY WITH MEDIAL MENISECTOMY, CHONDROPLASTY (Right) - 75min  SURGEON:  Surgeon(s) and Role:    Paralee Cancel, MD - Primary  PHYSICIAN ASSISTANT: None  ANESTHESIA:   general  EBL:  5 mL   BLOOD ADMINISTERED:none  DRAINS: none   LOCAL MEDICATIONS USED:  MARCAINE     SPECIMEN:  No Specimen  DISPOSITION OF SPECIMEN:  N/A  COUNTS:  YES  TOURNIQUET:  * No tourniquets in log *  DICTATION: .Other Dictation: Dictation Number [id number]  PLAN OF CARE: Discharge to home after PACU  PATIENT DISPOSITION:  PACU - hemodynamically stable.   Delay start of Pharmacological VTE agent (>24hrs) due to surgical blood loss or risk of bleeding: No

## 2017-11-01 NOTE — Anesthesia Postprocedure Evaluation (Signed)
Anesthesia Post Note  Patient: Rockland Kotarski  Procedure(s) Performed: KNEE ARTHROSCOPY WITH MEDIAL MENISECTOMY, CHONDROPLASTY (Right )     Patient location during evaluation: PACU Anesthesia Type: General Level of consciousness: awake and alert, oriented and patient cooperative Pain management: pain level controlled Vital Signs Assessment: post-procedure vital signs reviewed and stable Respiratory status: spontaneous breathing, nonlabored ventilation and respiratory function stable Cardiovascular status: blood pressure returned to baseline and stable Postop Assessment: no apparent nausea or vomiting Anesthetic complications: no    Last Vitals:  Vitals:   11/01/17 0930 11/01/17 1312  BP: (!) 143/88 138/78  Pulse: 61 62  Resp: 16 (!) 8  Temp: 36.9 C 36.9 C  SpO2: 98% 97%    Last Pain:  Vitals:   11/01/17 1330  TempSrc:   PainSc: 0-No pain                 Kennah Hehr,E. Micha Dosanjh

## 2017-11-01 NOTE — Transfer of Care (Signed)
  Last Vitals:  Vitals Value Taken Time  BP 138/78 11/01/2017  1:15 PM  Temp    Pulse 62 11/01/2017  1:16 PM  Resp 9 11/01/2017  1:16 PM  SpO2 95 % 11/01/2017  1:16 PM  Vitals shown include unvalidated device data.  Last Pain:  Vitals:   11/01/17 1008  TempSrc:   PainSc: 2       Patients Stated Pain Goal: 4 (11/01/17 1008)  Immediate Anesthesia Transfer of Care Note  Patient: Ricky Hawkins  Procedure(s) Performed: Procedure(s) (LRB): KNEE ARTHROSCOPY WITH MEDIAL MENISECTOMY, CHONDROPLASTY (Right)  Patient Location: PACU  Anesthesia Type: General  Level of Consciousness: awake, alert  and oriented  Airway & Oxygen Therapy: Patient Spontanous Breathing and Patient connected to nasal cannula oxygen  Post-op Assessment: Report given to PACU RN and Post -op Vital signs reviewed and stable  Post vital signs: Reviewed and stable  Complications: No apparent anesthesia complications

## 2017-11-02 ENCOUNTER — Encounter (HOSPITAL_BASED_OUTPATIENT_CLINIC_OR_DEPARTMENT_OTHER): Payer: Self-pay | Admitting: Orthopedic Surgery

## 2017-11-03 NOTE — Op Note (Signed)
NAME: Ricky Hawkins, Ricky Hawkins MEDICAL RECORD CV:[id number] ACCOUNT [id number] DATE OF BIRTH:Feb 16, 1970 FACILITY: [facility] LOCATION: [facility] PHYSICIAN:Tonji Elliff D. Kirsi Hugh, MD  OPERATIVE REPORT  DATE OF PROCEDURE:  11/01/2017  PREOPERATIVE DIAGNOSIS:  Right knee medial meniscal tears associated with chondromalacia of the patella and medially.  POSTOPERATIVE DIAGNOSES/FINDINGS:   1.  Complex tear of the right posterior horn of the medial meniscus with radial and cleavage components. 2.  Grade III chondromalacia noted on the medial aspect of the posterior distal medial femoral condyle. 3.  Grade III chondromalacia on the distal aspect of the medial femoral condyle. 4.  Small area of grade III chondromalacia on the medial trochlea. 5.  Grade II to III chondromalacia of the apex of the patella.  PROCEDURE:  Right knee diagnostic and operative arthroscopy with partial medial meniscectomy.  Medial and patellofemoral chondroplasty with debridement of chondral flaps, then microfracture abrasion chondroplasty carried out.  SURGEON:  Paralee Cancel, MD  ASSISTANT:  Surgical team.  ANESTHESIA:  Local plus general.  SPECIMENS:  None.  COMPLICATIONS:  None.  INDICATIONS FOR PROCEDURE:  The patient is a 47 year old male with recurrent persistent mechanical-based right knee symptoms.  He failed to respond to conservative treatment including activity modification, strengthening, injection therapy and  medication.  He had persistent mechanical-based medial symptoms with associated swelling and stiffness, an MRI which revealed meniscal tear, confirming our clinical diagnosis.  He wished to proceed with surgical intervention based on the failure to  respond to conservative treatment.  Risks of infection, DVT will be minimal.  The risk of potential recurrent meniscal pathology and chondromalacia discussed.  Consent was obtained.  PROCEDURE IN DETAIL:  The patient was brought to the operative theater.  Once  adequate anesthesia, preoperative antibiotics, and Ancef were administered, he was positioned supine with his right leg in a leg holder.  The right lower extremity was then  prepped and draped in sterile fashion.  Timeout was performed identifying the patient, the planned procedure and extremity.  Standard anterior, medial, superior and inferolateral portals were utilized.  Diagnostic evaluation of the knee revealed the  above findings.  The inferomedial portal was utilized.  It was still a working portal.  Following further examination of the meniscus, I used a combination of a straight-biting basket, followed by a 3.5 coude shaver to remove the meniscal fragments.  The  meniscus was removed, approximately 30% over the posterior horn to mid body region.  Conjoined the remaining meniscus back to a stable level.  In addition, the 3.5 coude shaver was used to remove chondral flaps noted on the distal aspect of the medial  femoral condyle.  He was noted also to have some erosive changes of the cartilage over the medial aspect of the posterior distal medial femoral condyle consistent with this meniscus tear.  Once I was satisfied with the stability of the remaining  articular meniscal cartilages, we examined and found that he had intact ACL.  His lateral compartment was normal.  Anteriorly, we noted grade II to III chondromalacia of the apex of the patella, which was debrided back to a stable level.  Following some  synovectomy for exposure purposes identified.  Then on the medial aspect of his trochlea, he had some grade II-III changes, which I removed some unstable flaps in this area.  There was no evidence of any eburnated bone in these regions.  Once I completed  the procedures, I reexamined his knee to make certain that I was satisfied with the stability of the cartilage  surfaces and that there was no evidence of any loose fragments of cartilage or cartilage floating around.  The instrumentation was then   removed from the knee.  The portal sites were reapproximated using 4-0 nylon.  The knee was injected at the end of the case with 0.25% Marcaine.  The knee was then wrapped in a sterile bulky Jones dressing.  He was brought to the recovery room in stable  condition, tolerating the procedure well.  Findings were reviewed with his wife.  We will see him back in the office in 12 days for suture removal.  LN/NUANCE  D:11/03/2017 T:11/03/2017 JOB:[id number]

## 2018-02-08 MED FILL — MELOXICAM 15 MG TABLET: 15 | 30 days supply | Qty: 30 | Fill #0

## 2018-03-12 MED FILL — MELOXICAM 15 MG TABLET: 15 | 30 days supply | Qty: 30 | Fill #1

## 2018-06-24 MED FILL — CELECOXIB 200 MG CAP: 200 | 60 days supply | Qty: 60 | Fill #0

## 2018-08-22 MED FILL — CELECOXIB 200 MG CAP: 200 | 60 days supply | Qty: 60 | Fill #1

## 2018-08-28 ENCOUNTER — Encounter: Payer: Self-pay | Admitting: Neurology

## 2018-09-05 ENCOUNTER — Ambulatory Visit: Payer: No Typology Code available for payment source | Admitting: Neurology

## 2018-09-05 ENCOUNTER — Other Ambulatory Visit: Payer: Self-pay

## 2018-09-05 ENCOUNTER — Encounter: Payer: Self-pay | Admitting: Neurology

## 2018-09-05 ENCOUNTER — Institutional Professional Consult (permissible substitution): Payer: 59 | Admitting: Neurology

## 2018-09-05 VITALS — BP 141/83 | HR 72 | Temp 99.3°F | Ht 74.0 in | Wt 240.0 lb

## 2018-09-05 DIAGNOSIS — G4733 Obstructive sleep apnea (adult) (pediatric): Secondary | ICD-10-CM

## 2018-09-05 DIAGNOSIS — M7652 Patellar tendinitis, left knee: Secondary | ICD-10-CM

## 2018-09-05 DIAGNOSIS — G478 Other sleep disorders: Secondary | ICD-10-CM

## 2018-09-05 DIAGNOSIS — R0689 Other abnormalities of breathing: Secondary | ICD-10-CM | POA: Diagnosis not present

## 2018-09-05 NOTE — Progress Notes (Signed)
SLEEP MEDICINE CLINIC    Provider:  Larey Seat, MD  Primary Care Physician:  Haywood Pao, [place] [address]     Referring Provider: Haywood Pao, [place],  [place] [address]          Chief Complaint according to patient   Patient presents with:    . New Patient (Initial Visit)           HISTORY OF PRESENT ILLNESS:  Ricky Hawkins is a 48 y.o. year old White or Caucasian male patient seen here on 09/05/2018 in a face to face visit, referred by Dr.Tisovec.   Chief concern according to patient : Snoring since childhood, supine sleeper, non restorative sleep    I have the pleasure of seeing Ricky Hawkins today, a right-handed White or Caucasian male with a possible sleep disorder.  he is married to Ricky [place], MD and  has a past medical history of Acute lateral meniscus tear of right knee, GERD (gastroesophageal reflux disease), Pneumonia, and Skin cancer, basal cell (2018).  The patient had the first sleep study in the year 2014 at [facility] and was told there wasn't enough apnea , only snoring. Dr. Gala Romney however felt that he has meanwhile progressed to sleep disordered breathing and wanted a re- evauation.   Sleep relevant medical history: low testosterone, nasal rhinitis, and lifelong snoring. He has been exercising less after multiple joint injuries, and this made snoring louder. Nocturia twice.    Family medical /sleep history: no other family member with OSA, insomnia, and no sleep walkers.    Social history:  Patient is working in Press [employer], driving long distances, often on the road.  Family status is married, with 3 children. Pets are present. Tobacco use :none /  ETOH use; wine or beer 4 / week , Caffeine intake in form of Coffee( 3 cups in AM ) Soda( none Tea ( with lunch / dinner) -No energy drinks. Regular exercise i ; to bike, run   Hobbies :sports.     Sleep habits are as follows: The  patient's dinner time is between 6.-7PM. The patient goes to bed at 11 PM and goes to sleep easily-  continues to sleep for many hours, wakes for 2 bathroom breaks,and when wife nudges him .  The preferred sleep position is supine, with the support of 1-2  pillows.  Dreams are reportedly rare.  Wife rises at 5 AM, 6.30  AM is his usual rise time. this has shifted during [place].  The patient wakes up with a backup alarm.  Hereports not feeling refreshed or restored in AM, with symptoms such as dry mouth,congestion, phlegm, but no morning headaches- and residual fatigue.  Naps are taken very  infrequently, some weekends 20-30 minutes- pulls over in the car/..    Review of Systems: Out of a complete 14 system review, the patient complains of only the following symptoms, and all other reviewed systems are negative.:  Fatigue, sleepiness , witnessed apnea - now on tape.   snoring, non restorative sleep, nocturia.    How likely are you to doze in the following situations: 0 = not likely, 1 = slight chance, 2 = moderate chance, 3 = high chance   Sitting and Reading? Watching Television? Sitting inactive in a public place (theater or meeting)? As a passenger in a car for an hour without a break? Lying down in the afternoon when circumstances permit? Sitting and  talking to someone? Sitting quietly after lunch without alcohol? In a car, while stopped for a few minutes in traffic?   Total = 7/ 24 points   FSS endorsed at 32/ 63 points.   Social History   Socioeconomic History  . Marital status: Married    Spouse name: Not on file  . Number of children: Not on file  . Years of education: Not on file  . Highest education level: Not on file  Occupational History  . Occupation: Programmer, applications   Social Needs  . Financial resource strain: Not on file  . Food insecurity    Worry: Not on file    Inability: Not on file  . Transportation needs    Medical: Not on file    Non-medical: Not on file   Tobacco Use  . Smoking status: Never Smoker  . Smokeless tobacco: Never Used  Substance and Sexual Activity  . Alcohol use: Yes    Comment: 1 -2 GLASSES OF WINE PER NIGHT  . Drug use: No  . Sexual activity: Not on file  Lifestyle  . Physical activity    Days per week: Not on file    Minutes per session: Not on file  . Stress: Not on file  Relationships  . Social Herbalist on phone: Not on file    Gets together: Not on file    Attends religious service: Not on file    Active member of club or organization: Not on file    Attends meetings of clubs or organizations: Not on file    Relationship status: Not on file  Other Topics Concern  . Not on file  Social History Narrative  . Not on file    Family History  Problem Relation Age of Onset  . Heart disease Father   . Hypertension Father   . Hyperlipidemia Father     Past Medical History:  Diagnosis Date  . Acute lateral meniscus tear of right knee    RIGHT KNEE  . GERD (gastroesophageal reflux disease)   . Pneumonia    DX 2 WEEKS AGO COMPLETED 7 DAYS LEVAQUIN DOING CHEST XRAY MONDAY OR TUESDAY 10-28 OR 10-28  . Skin cancer, basal cell 2018   right calf and under nose    Past Surgical History:  Procedure Laterality Date  . CARPAL TUNNEL RELEASE Right 02/24/2016   Procedure: CARPAL TUNNEL RELEASE;  Surgeon: Iran Planas, MD;  Location: [facility];  Service: Orthopedics;  Laterality: Right;  . FRACTURE SURGERY  2014   RIGHT METARAPAL FX   . HAND SURGERY Left 2017   CARPAL TUNNEL RELEASE  . KNEE ARTHROSCOPY WITH MEDIAL MENISECTOMY Right 11/01/2017   Procedure: KNEE ARTHROSCOPY WITH MEDIAL MENISECTOMY, CHONDROPLASTY;  Surgeon: Paralee Cancel, MD;  Location[facility]  Service: Orthopedics;  Laterality: Right;  81min     Current Outpatient Medications on File Prior to Visit  Medication Sig Dispense Refill  . celecoxib (CELEBREX) 200 MG capsule     . loratadine (CLARITIN) 10 MG tablet Take 10 mg  by mouth daily.     No current facility-administered medications on file prior to visit.     Allergies  Allergen Reactions  . No Known Allergies     Physical exam:  Today's Vitals   09/05/18 0959  BP: (!) 141/83  Pulse: 72  Temp: 99.3 F (37.4 C)  Weight: 240 lb (108.9 kg)  Height: 6\' 2"  (1.88 m)   Body mass index is  30.81 kg/m.   Wt Readings from Last 3 Encounters:  09/05/18 240 lb (108.9 kg)  11/01/17 231 lb 9.6 oz (105.1 kg)  07/02/17 223 lb 9.6 oz (101.4 kg)     Ht Readings from Last 3 Encounters:  09/05/18 6\' 2"  (1.88 m)  11/01/17 6\' 2"  (1.88 m)  07/02/17 6\' 2"  (1.88 m)      General: The patient is awake, alert and appears not in acute distress. The patient is well groomed. Head: Normocephalic, atraumatic. Neck is supple. Mallampati 3 tip of uvula lingers on the tongue ground. ,  neck circumference:17 inches. Nasal airflow  patent.  Prognathia is seen.  Dental status:  Cardiovascular:  Regular rate and cardiac rhythm by pulse,  without distended neck veins. Respiratory: Lungs are clear to auscultation.  Skin:  Without evidence of ankle edema, or rash. Trunk: The patient's posture is erect.   Neurologic exam : The patient is awake and alert, oriented to place and time.   Memory subjective described as intact.  Attention span & concentration ability appears normal.  Speech is fluent,  without  dysarthria, dysphonia or aphasia.  Mood and affect are appropriate.   Cranial nerves: no loss of smell or taste reported.   Pupils are equal and briskly reactive to light. Funduscopic exam deferred. .  Extraocular movements in vertical and horizontal planes were intact and without nystagmus. No Diplopia. Visual fields by finger perimetry are intact. Hearing was intact to soft voice and finger rubbing.    Facial sensation intact to fine touch.  Facial motor strength is symmetric and tongue and uvula move midline.  Neck ROM : rotation, tilt and flexion extension  were normal for age and shoulder shrug was symmetrical.    Motor exam:  Symmetric bulk, tone and ROM.   Normal tone without cog wheeling, symmetric grip strength .   Sensory:  Fine touch, pinprick and vibration were tested  and  normal.  Proprioception tested in the upper extremities was normal.   Coordination: Rapid alternating movements in the fingers/hands were of normal speed.  The Finger-to-nose maneuver was intact without evidence of ataxia, dysmetria or tremor.  Gait and station: Patient could rise unassisted from a seated position, walked without assistive device.  He has gait changes due to knee damage on the right.  Stance is of normal width/ base and the patient turned with 3 steps.  Toe and heel walk were deferred.  Deep tendon reflexes: in the upper and lower extremities are symmetric and intact.  Babinski response was deferred.       After spending a total time of 35 minutes face to face and additional time for physical and neurologic examination, review of laboratory studies,  personal review of imaging studies, reports and results of other testing and review of referral information / records as far as provided in visit, I have established the following assessments:  1) Witnessed apnea and snoring. Non restorative sleep, and some fatigue.    My Plan is to proceed with:  1) Attended sleep study as previous HST was without result. The gasping for air and jerking movements can accompany central apnea.     I would like to thank Tisovec, Fransico Him, MD and Haywood Pao, Md [address] [place],  [place] [address] for allowing me to meet with and to take care of this pleasant patient.   In short, Ricky Hawkins is presenting with non restorative sleep.  Electronically signed by: Larey Seat, MD 09/05/2018 10:04 AM  [facility] and [employer] certified by [employer] of Sleep Medicine and Diplomate of the [employer]  of Sleep Medicine. Board certified In Neurology through the [place], Fellow of the [employer] of Neurology. Medical Director of [employer].

## 2018-10-03 ENCOUNTER — Other Ambulatory Visit: Payer: Self-pay

## 2018-10-03 ENCOUNTER — Ambulatory Visit (INDEPENDENT_AMBULATORY_CARE_PROVIDER_SITE_OTHER): Payer: No Typology Code available for payment source | Admitting: Neurology

## 2018-10-03 DIAGNOSIS — G4733 Obstructive sleep apnea (adult) (pediatric): Secondary | ICD-10-CM

## 2018-10-03 DIAGNOSIS — G478 Other sleep disorders: Secondary | ICD-10-CM

## 2018-10-03 DIAGNOSIS — M7652 Patellar tendinitis, left knee: Secondary | ICD-10-CM

## 2018-10-03 DIAGNOSIS — R0689 Other abnormalities of breathing: Secondary | ICD-10-CM

## 2018-10-14 DIAGNOSIS — G478 Other sleep disorders: Secondary | ICD-10-CM | POA: Insufficient documentation

## 2018-10-14 DIAGNOSIS — R0689 Other abnormalities of breathing: Secondary | ICD-10-CM | POA: Insufficient documentation

## 2018-10-14 NOTE — Procedures (Signed)
PATIENT'S NAME:  Ricky Hawkins, E[place] DOB:      Apr 07, 1970      MR#:    [id number]     DATE OF RECORDING: 10/03/2018 REFERRING M.D.:  Domenick Gong, MD Study Performed:   Diagnostic Polysomnogram HISTORY:  Ricky Hawkins is a right-handed White or Caucasian male with a concern about gasping in he is sleep, limb jerking and non -restorative sleep.   The patient had the first sleep study in the year 2014 at [facility] and was told:"there wasn't enough apnea , only snoring". Dr. Gala Romney however felt that he has meanwhile progressed to sleep disordered breathing and wanted a re- evaluation.  Medical history: Acute lateral meniscus tear of right knee, GERD (gastroesophageal reflux disease), Pneumonia, and Skin cancer, basal cell (2018).  Sleep relevant medical history: low testosterone, nasal rhinitis, and lifelong snoring. He has been exercising less after multiple joint injuries, and this made snoring louder. Nocturia twice at night.   The patient endorsed the Epworth Sleepiness Scale at 7 points.  The patient's weight 240 pounds with a height of 74 (inches), resulting in a BMI of 30.8 kg/m2.The patient's neck circumference measured 17 inches.  CURRENT MEDICATIONS: Celebrex, Claritin   PROCEDURE:  This is a multichannel digital polysomnogram utilizing the Somnostar 11.2 system.  Electrodes and sensors were applied and monitored per AASM Specifications.   EEG, EOG, Chin and Limb EMG, were sampled at 200 Hz.  ECG, Snore and Nasal Pressure, Thermal Airflow, Respiratory Effort, CPAP Flow and Pressure, Oximetry was sampled at 50 Hz. Digital video and audio were recorded.      BASELINE STUDY: Lights Out was at 21:44 and Lights On at 04:55.  Total recording time (TRT) was 432 minutes, with a total sleep time (TST) of 404.5 minutes. The patient's sleep latency was 10 minutes. REM latency was 74.5 minutes.  The sleep efficiency was 93.6 %.     SLEEP ARCHITECTURE: WASO (Wake after sleep onset) was 16.5  minutes.  There were 18 minutes in Stage N1, 251 minutes Stage N2, 6.5 minutes Stage N3 and 129 minutes in Stage REM.  The percentage of Stage N1 was 4.4%, Stage N2 was 62.1%, Stage N3 was 1.6% and Stage R (REM sleep) was 31.9%.   RESPIRATORY ANALYSIS:  There were a total of 79 respiratory events:  0 apneas and 79 hypopneas with isolated respiratory event related arousals (RERAs).     The total APNEA/HYPOPNEA INDEX (AHI) was 11.7 /hour.  47 events occurred in REM sleep and 64 events in NREM.  The REM AHI was 21.9 /hour, versus a non-REM AHI of 7.0/h. The patient spent 168.5 minutes of total sleep time in the supine position and 236 minutes in non-supine. The supine AHI was 21.7/h versus a non-supine AHI of 4.6/h.  OXYGEN SATURATION & C02:  The Wake baseline 02 saturation was 94%, with the lowest being 80%. Time spent below 89% saturation equaled 8 minutes.    The patient had a total of 0 Periodic Limb Movements The arousals were noted as: 28 were spontaneous, 0 were associated with PLMs, and 31 were associated with respiratory events.  Audio and video analysis did not show any abnormal or unusual movements, behaviors, phonations or vocalizations.  Snoring was noted. EKG was in keeping with normal sinus rhythm (NSR) but with an unusual PQRS complex. Post-study, the patient indicated that sleep was the same as usual.    IMPRESSION: High sleep efficiency without evidence of insomnia, hypoxemia, or EEG abnormalities.   1.  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  Strong REM sleep and supine sleep position accentuated AHI to 21.9 and 21.7/h.  2. Snoring, sudden gasping.   RECOMMENDATIONS:  1. Avoid supine sleep. 2. I recommend an auto-CPAP titration to optimize therapy, as REM dependent apnea is not as likely to respond to a dental device. I suggest a setting with heated humidity, pressure setting from 6 through 12 cm water, 1 cm EPR and mask of patient's choice and comfort.       I certify that I have reviewed the entire raw data recording prior to the issuance of this report in accordance with the Standards of Accreditation of the American Academy of Sleep Medicine (AASM)    Larey Seat, MD   10-14-2018 Diplomat, American Board of Psychiatry and Neurology  Diplomat, American Board of [place] Director, [place] [facility] [employer]

## 2018-10-14 NOTE — Addendum Note (Signed)
Addended by: Larey Seat on: 10/14/2018 06:41 PM   Modules accepted: Orders

## 2018-10-15 ENCOUNTER — Telehealth: Payer: Self-pay | Admitting: Neurology

## 2018-10-15 NOTE — Telephone Encounter (Signed)
I called pt. I advised pt that Dr. Brett Fairy reviewed their sleep study results and found that pt has mild to moderate sleep apnea. Dr. Brett Fairy recommends that pt start auto CPAP. I reviewed PAP compliance expectations with the pt. Pt is agreeable to starting a CPAP. I advised pt that an order will be sent to a DME, [facility], and [facility] will call the pt within about one week after they file with the pt's insurance. [facility] will show the pt how to use the machine, fit for masks, and troubleshoot the CPAP if needed. A follow up appt was made for insurance purposes with Dr. Brett Fairy on Dec 14,2020 at 10:30 am. Pt verbalized understanding to arrive 15 minutes early and bring their CPAP. A letter with all of this information in it will be mailed to the pt as a reminder. I verified with the pt that the address we have on file is correct. Pt verbalized understanding of results. Pt had no questions at this time but was encouraged to call back if questions arise. I have sent the order to [facility] and have received confirmation that they have received the order.

## 2018-10-15 NOTE — Telephone Encounter (Signed)
-----   Message from Larey Seat, MD sent at 10/14/2018  6:41 PM EDT ----- IMPRESSION:  High sleep efficiency without evidence of insomnia, hypoxemia, or  EEG abnormalities.   1. 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   Strong REM sleep and supine sleep position accentuated AHI to  21.9 and 21.7/h.  2. Snoring, sudden gasping.    RECOMMENDATIONS:   1. Avoid supine sleep.  2. I recommend an auto-CPAP titration to optimize therapy, as REM  dependent apnea is not as likely to respond to a dental device. I  suggest a setting with heated humidity, pressure setting from 6  through 12 cm water, 1 cm EPR and mask of patient's choice and  comfort.

## 2018-12-02 ENCOUNTER — Encounter: Payer: Self-pay | Admitting: Internal Medicine

## 2018-12-12 ENCOUNTER — Telehealth: Payer: Self-pay | Admitting: Neurology

## 2018-12-12 NOTE — Telephone Encounter (Signed)
Called the patient because I was reviewing CPAP data and saw his set up date was 11/18/18. He is scheduled to come in for initial CPAP visit on 12/16/18 which puts him being seen 3 days too soon.  Called the patient to reschedule his apt. There was no answer. LVM for the patient to call back.  When patient calls back pt needs to be seen between 12/19/2018-02/18/19. This can be with MD or NP.

## 2018-12-16 ENCOUNTER — Ambulatory Visit: Payer: Self-pay | Admitting: Neurology

## 2018-12-16 NOTE — Telephone Encounter (Signed)
Called the patient to discuss not coming in today. Patient stated he attempted to call back but was Friday after hours. I have cancelled the apt for today and the patient states he will call back to reschedule.  Patient can see MD or NP and must be scheduled within  12/19/2018-02/18/2019 for initial CPAP visit

## 2018-12-23 ENCOUNTER — Encounter: Payer: Self-pay | Admitting: Internal Medicine

## 2018-12-23 ENCOUNTER — Other Ambulatory Visit: Payer: Self-pay

## 2018-12-23 ENCOUNTER — Ambulatory Visit: Payer: No Typology Code available for payment source | Admitting: *Deleted

## 2018-12-23 VITALS — Ht 74.0 in | Wt 235.0 lb

## 2018-12-23 DIAGNOSIS — Z1211 Encounter for screening for malignant neoplasm of colon: Secondary | ICD-10-CM

## 2018-12-23 DIAGNOSIS — Z1159 Encounter for screening for other viral diseases: Secondary | ICD-10-CM

## 2018-12-23 NOTE — Progress Notes (Signed)
Pt is aware that care partner will wait in the car during procedure; if they feel like they will be too hot or cold to wait in the car; they may wait in the 4 th floor lobby. Patient is aware to bring only one care partner. We want them to wear a mask (we do not have any that we can provide them), practice social distancing, and we will check their temperatures when they get here.  I did remind the patient that their care partner needs to stay in the parking lot the entire time and have a cell phone available, we will call them when the pt is ready for discharge. Patient will wear mask into building.   Pt's previsit is done over the phone and all paperwork (prep instructions, blank consent form to just read over, pre-procedure acknowledgement form and stamped envelope) sent to patient  No egg or soy allergy  No home oxygen use or problems with anesthesia  No medications for weight loss taken  emmi information given  Pt will be out of town from 12-28 to 01-05-19.  I spoke with [place] at [facility] and states the results will in at 1900 that evening

## 2018-12-24 MED FILL — FLUTICASONE PROP 0.05% CRM: 0.05 | 10 days supply | Qty: 30 | Fill #0

## 2019-01-03 HISTORY — PX: COLONOSCOPY W/ POLYPECTOMY: SHX1380

## 2019-01-06 ENCOUNTER — Other Ambulatory Visit: Payer: Self-pay | Admitting: Internal Medicine

## 2019-01-06 ENCOUNTER — Ambulatory Visit (INDEPENDENT_AMBULATORY_CARE_PROVIDER_SITE_OTHER): Payer: Self-pay

## 2019-01-06 DIAGNOSIS — Z1159 Encounter for screening for other viral diseases: Secondary | ICD-10-CM

## 2019-01-06 LAB — SARS CORONAVIRUS 2 (TAT 6-24 HRS): SARS Coronavirus 2: NEGATIVE

## 2019-01-07 ENCOUNTER — Other Ambulatory Visit: Payer: Self-pay

## 2019-01-07 ENCOUNTER — Encounter: Payer: Self-pay | Admitting: Internal Medicine

## 2019-01-07 ENCOUNTER — Ambulatory Visit (AMBULATORY_SURGERY_CENTER): Payer: BC Managed Care – PPO | Admitting: Internal Medicine

## 2019-01-07 VITALS — BP 126/82 | HR 60 | Temp 98.9°F | Resp 14 | Ht 74.0 in | Wt 235.0 lb

## 2019-01-07 DIAGNOSIS — Z1211 Encounter for screening for malignant neoplasm of colon: Secondary | ICD-10-CM

## 2019-01-07 DIAGNOSIS — D122 Benign neoplasm of ascending colon: Secondary | ICD-10-CM | POA: Diagnosis not present

## 2019-01-07 DIAGNOSIS — D123 Benign neoplasm of transverse colon: Secondary | ICD-10-CM | POA: Diagnosis not present

## 2019-01-07 DIAGNOSIS — D128 Benign neoplasm of rectum: Secondary | ICD-10-CM | POA: Diagnosis not present

## 2019-01-07 DIAGNOSIS — D124 Benign neoplasm of descending colon: Secondary | ICD-10-CM

## 2019-01-07 MED ORDER — SODIUM CHLORIDE 0.9 % IV SOLN: 500.0000 mL | Freq: Once | INTRAVENOUS | Status: DC

## 2019-01-07 NOTE — Patient Instructions (Addendum)
I found and removed 4 tiny polyps that all look benign but some may be pre-cancerous.  This is very common and you should not worry about this.  I will let you know pathology results and when to have another routine colonoscopy by mail and/or My Chart.  YOU DO NOT NEED  AND SHOULD NOT DO ANNUAL STOOL HEMOCCULT TESTS IF OFFERED  I appreciate the opportunity to care for you. Gatha Mayer, MD, FACG    YOU HAD AN ENDOSCOPIC PROCEDURE TODAY AT [facility]:   Refer to the procedure report that was given to you for any specific questions about what was found during the examination.  If the procedure report does not answer your questions, please call your gastroenterologist to clarify.  If you requested that your care partner not be given the details of your procedure findings, then the procedure report has been included in a sealed envelope for you to review at your convenience later.  YOU SHOULD EXPECT: Some feelings of bloating in the abdomen. Passage of more gas than usual.  Walking can help get rid of the air that was put into your GI tract during the procedure and reduce the bloating. If you had a lower endoscopy (such as a colonoscopy or flexible sigmoidoscopy) you may notice spotting of blood in your stool or on the toilet paper. If you underwent a bowel prep for your procedure, you may not have a normal bowel movement for a few days.  Please Note:  You might notice some irritation and congestion in your nose or some drainage.  This is from the oxygen used during your procedure.  There is no need for concern and it should clear up in a day or so.  SYMPTOMS TO REPORT IMMEDIATELY:   Following lower endoscopy (colonoscopy or flexible sigmoidoscopy):  Excessive amounts of blood in the stool  Significant tenderness or worsening of abdominal pains  Swelling of the abdomen that is new, acute  Fever of 100F or higher    For urgent or emergent issues, a gastroenterologist  can be reached at any hour by calling [phone].   DIET:  We do recommend a small meal at first, but then you may proceed to your regular diet.  Drink plenty of fluids but you should avoid alcoholic beverages for 24 hours.  ACTIVITY:  You should plan to take it easy for the rest of today and you should NOT DRIVE or use heavy machinery until tomorrow (because of the sedation medicines used during the test).    FOLLOW UP: Our staff will call the number listed on your records 48-72 hours following your procedure to check on you and address any questions or concerns that you may have regarding the information given to you following your procedure. If we do not reach you, we will leave a message.  We will attempt to reach you two times.  During this call, we will ask if you have developed any symptoms of COVID 19. If you develop any symptoms (ie: fever, flu-like symptoms, shortness of breath, cough etc.) before then, please call [phone].  If you test positive for Covid 19 in the 2 weeks post procedure, please call and report this information to [place].    If any biopsies were taken you will be contacted by phone or by letter within the next 1-3 weeks.  Please call us at [phone] if you have not heard about the biopsies in 3 weeks.    SIGNATURES/CONFIDENTIALITY: You  and/or your care partner have signed paperwork which will be entered into your electronic medical record.  These signatures attest to the fact that that the information above on your After Visit Summary has been reviewed and is understood.  Full responsibility of the confidentiality of this discharge information lies with you and/or your care-partner.   Resume medications. Information given on polyps.

## 2019-01-07 NOTE — Op Note (Signed)
[facility] Patient Name: Ricky Hawkins Procedure Date: 01/07/2019 8:55 AM MRN: [id number] Endoscopist: Gatha Mayer , MD Age: 49 Referring MD:  Date of Birth: [birth year] Gender: Male Account #: [id number] Procedure:                Colonoscopy Indications:              Screening for colorectal malignant neoplasm, This                            is the patient's first colonoscopy Medicines:                Propofol per Anesthesia, Monitored Anesthesia Care Procedure:                Pre-Anesthesia Assessment:                           - Prior to the procedure, a History and Physical                            was performed, and patient medications and                            allergies were reviewed. The patient's tolerance of                            previous anesthesia was also reviewed. The risks                            and benefits of the procedure and the sedation                            options and risks were discussed with the patient.                            All questions were answered, and informed consent                            was obtained. Prior Anticoagulants: The patient has                            taken no previous anticoagulant or antiplatelet                            agents. ASA Grade Assessment: II - A patient with                            mild systemic disease. After reviewing the risks                            and benefits, the patient was deemed in                            satisfactory condition to undergo the procedure.  After obtaining informed consent, the colonoscope                            was passed under direct vision. Throughout the                            procedure, the patient's blood pressure, pulse, and                            oxygen saturations were monitored continuously. The                            Colonoscope was introduced through the anus and   advanced to the the cecum, identified by                            appendiceal orifice and ileocecal valve. The                            colonoscopy was performed without difficulty. The                            patient tolerated the procedure well. The quality                            of the bowel preparation was excellent. The bowel                            preparation used was Miralax via split dose                            instruction. The ileocecal valve, appendiceal                            orifice, and rectum were photographed. Scope In: 9:22:19 AM Scope Out: 9:47:03 AM Scope Withdrawal Time: 0 hours 20 minutes 24 seconds  Total Procedure Duration: 0 hours 24 minutes 44 seconds  Findings:                 The perianal and digital rectal examinations were                            normal. Pertinent negatives include normal prostate                            (size, shape, and consistency).                           Four flat and sessile polyps were found in the                            rectum, descending colon, transverse colon and                            ascending colon. The polyps were diminutive  in                            size. These polyps were removed with a cold snare.                            Resection and retrieval were complete. Verification                            of patient identification for the specimen was                            done. Estimated blood loss was minimal.                           The exam was otherwise without abnormality on                            direct and retroflexion views. Complications:            No immediate complications. Estimated Blood Loss:     Estimated blood loss was minimal. Impression:               - Four diminutive polyps in the rectum, in the                            descending colon, in the transverse colon and in                            the ascending colon, removed with a cold snare.                             Resected and retrieved.                           - The examination was otherwise normal on direct                            and retroflexion views. Recommendation:           - Patient has a contact number available for                            emergencies. The signs and symptoms of potential                            delayed complications were discussed with the                            patient. Return to normal activities tomorrow.                            Written discharge instructions were provided to the                            patient.                           -  Resume previous diet.                           - Continue present medications.                           - Repeat colonoscopy is recommended. The                            colonoscopy date will be determined after pathology                            results from today's exam become available for                            review. Gatha Mayer, MD 01/07/2019 9:56:31 AM This report has been signed electronically.

## 2019-01-07 NOTE — Progress Notes (Signed)
Pt's states no medical or surgical changes since previsit or office visit.  Temp JB VS JJ

## 2019-01-07 NOTE — Progress Notes (Signed)
To PACU, VSS. Report to [email or web address].tb 

## 2019-01-08 ENCOUNTER — Institutional Professional Consult (permissible substitution): Payer: No Typology Code available for payment source | Admitting: Pulmonary Disease

## 2019-01-08 NOTE — Progress Notes (Deleted)
    Subjective:   PATIENT ID: Ricky Hawkins GENDER: male DOB: 1970-08-12, MRN: [id number]   HPI  No chief complaint on file.   Reason for Visit: New consult for wheezing. Referred by Dr. Osborne Casco, MD [facility]  [name] is a 49 year old male *** smoker with suspected sleep apnea, *** who presents as a consult for wheezing *** Social History:  Environmental exposures: ***  I have personally reviewed patient's past medical/family/social history, allergies, current medications.***  Past Medical History:  Diagnosis Date  . Acute lateral meniscus tear of right knee    RIGHT KNEE  . Allergy   . GERD (gastroesophageal reflux disease)   . Pneumonia   . Skin cancer, basal cell 2018   right calf and under nose  . Sleep apnea    wears CPAP     Family History  Problem Relation Age of Onset  . Heart disease Father   . Hypertension Father   . Hyperlipidemia Father   . Colon cancer Neg Hx   . Esophageal cancer Neg Hx   . Stomach cancer Neg Hx   . Rectal cancer Neg Hx      Social History   Occupational History  . Occupation: Programmer, applications rep    Employer: [employer]   Tobacco Use  . Smoking status: Never Smoker  . Smokeless tobacco: Never Used  Substance and Sexual Activity  . Alcohol use: Yes    Comment: 1 GLASS OF WINE PER NIGHT  . Drug use: No  . Sexual activity: Not on file    Allergies  Allergen Reactions  . No Known Allergies      Outpatient Medications Prior to Visit  Medication Sig Dispense Refill  . celecoxib (CELEBREX) 200 MG capsule Hasn't taken in over a month    . fexofenadine (ALLEGRA) 180 MG tablet Take 180 mg by mouth daily.    Marland Kitchen GLUCOSAMINE-CHONDROITIN PO Take by mouth daily.    Marland Kitchen loratadine (CLARITIN) 10 MG tablet Take 10 mg by mouth daily.     No facility-administered medications prior to visit.    ROS   Objective:  There were no vitals filed for this visit.    Physical Exam: General:  Well-appearing, no acute distress HENT: [place], AT, OP clear, MMM Eyes: EOMI, no scleral icterus Respiratory: Clear to auscultation bilaterally.  No crackles, wheezing or rales Cardiovascular: RRR, -M/R/G, no JVD GI: BS+, soft, nontender Extremities:-Edema,-tenderness Neuro: AAO x4, CNII-XII grossly intact Skin: Intact, no rashes or bruising Psych: Normal mood, normal affect  Data Reviewed:  Imaging:  PFT:  Labs:      Assessment & Plan:   Discussion: ***  Health Maintenance Immunization History  Administered Date(s) Administered  . Influenza Split 10/03/2011, 10/02/2012  . Influenza,inj,Quad PF,6+ Mos 10/19/2014, 09/24/2015, 09/15/2016   CT Lung Screen***  No orders of the defined types were placed in this encounter. No orders of the defined types were placed in this encounter.   No follow-ups on file.  I have spent a total time of***-minutes on the day of the appointment reviewing prior documentation, coordinating care and discussing medical diagnosis and plan with the patient/family. Imaging, labs and tests included in this note have been reviewed and interpreted independently by me.  [place], MD [facility] 01/08/2019 8:37 AM  Office Number [phone]

## 2019-01-09 ENCOUNTER — Telehealth: Payer: Self-pay | Admitting: *Deleted

## 2019-01-09 NOTE — Telephone Encounter (Signed)
  Follow up Call-  Call back number 01/07/2019  Post procedure Call Back phone  # [phone]  Permission to leave phone message Yes  Some recent data might be hidden     Patient questions:  Do you have a fever, pain , or abdominal swelling? No. Pain Score  0 *  Have you tolerated food without any problems? Yes.    Have you been able to return to your normal activities? Yes.    Do you have any questions about your discharge instructions: Diet   No. Medications  No. Follow up visit  No.  Do you have questions or concerns about your Care? No.  Actions: * If pain score is 4 or above: No action needed, pain <4.  1. Have you developed a fever since your procedure? no  2.   Have you had an respiratory symptoms (SOB or cough) since your procedure? no  3.   Have you tested positive for COVID 19 since your procedure no  4.   Have you had any family members/close contacts diagnosed with the COVID 19 since your procedure?  no   If yes to any of these questions please route to Joylene John, RN and Alphonsa Gin, Therapist, sports.

## 2019-01-16 ENCOUNTER — Ambulatory Visit: Payer: BC Managed Care – PPO | Admitting: Pulmonary Disease

## 2019-01-16 ENCOUNTER — Encounter: Payer: Self-pay | Admitting: Pulmonary Disease

## 2019-01-16 ENCOUNTER — Other Ambulatory Visit: Payer: Self-pay

## 2019-01-16 DIAGNOSIS — R0600 Dyspnea, unspecified: Secondary | ICD-10-CM

## 2019-01-16 DIAGNOSIS — G4733 Obstructive sleep apnea (adult) (pediatric): Secondary | ICD-10-CM | POA: Diagnosis not present

## 2019-01-16 DIAGNOSIS — R0609 Other forms of dyspnea: Secondary | ICD-10-CM

## 2019-01-16 NOTE — Patient Instructions (Addendum)
You have mild obstructive sleep apnea I will review the report   Wheezing could be related to allergies/sinus drip or silent reflux We can undertake a sequential trial of decongestant and Prilosec for 4 weeks  If shortness of breath persists, let me know and we can schedule breathing test -lets touch base over the phone in 3 months

## 2019-01-16 NOTE — Progress Notes (Signed)
Subjective:    Patient ID: Ricky Hawkins, male    DOB: 1970-04-07, 49 y.o.   MRN: [id number]  HPI  Chief Complaint  Patient presents with  . Consult    Patient is here for wheezing that started about 6 months ago. Patient notices it when he is exhaling while laying down. Patient has productive cough in the morning with yellow sputum. Patient has had some snoring and had sleep study in October and has started on CPAP. Patient gets winded going upstairs.   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  He was found to have mild OSA and after discussion, decided on weight loss measures and dental appliance therapy. His sleep disturbance has persisted over the years and he underwent reevaluation by an overnight PSG, he had gained about 15 pounds to his current weight of 248 pounds, this showed again mild OSA, significantly worse in the supine position.  He has been started on CPAP with a full facemask and has noted some improvement in his daytime somnolence and fatigue.  This evaluation was done by neurology and he would like [place] to resume care of his CPAP.  He denies any problems with mask or pressure There is no history suggestive of cataplexy, sleep paralysis or parasomnias There is no excessive use of caffeinated beverages  He also reports intermittent wheezing and dyspnea.  Whistling in his chest has been noted by his wife especially when he lies down to sleep at night.  He is active and [place] occasionally has dyspnea climbing stairs.  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 He reports a diagnosis of walking pneumonia in 2019 2 weeks prior to  scheduled knee surgery.  Chest x-ray on 10/31/2017 showed persistent minimal right upper lobe infiltrate compared to 10/11/2017 However on the day of surgery 11/01/2017 he had another chest x-ray done which I reviewed personally which appears clear and he was able to proceed with surgery He denies childhood history of asthma. He denies significant postnasal drip but does have allergy symptoms for which he takes Allegra all year round.  He denies overt reflux symptoms but does have occasional meal related reflux. Environment-no new pillows blankets, carpets in the bedroom, daughter has a new pet rabbit but no other significant exposure identified      Significant tests/ events reviewed HST 09/2011 AHI 12/h, lowest desat 78%-weight 233 pounds  NPSG 10/2018 wt 248 - TST 404 m, AHI 12/hour, worse in supine position   Past Medical History:  Diagnosis Date  . Acute lateral meniscus tear of right knee    RIGHT KNEE  . Allergy   . GERD (gastroesophageal reflux disease)   . Pneumonia   . Skin cancer, basal cell 2018   right calf and under nose  . Sleep apnea    wears CPAP    Past Surgical History:  Procedure Laterality Date  . CARPAL TUNNEL RELEASE Right 02/24/2016   Procedure: CARPAL TUNNEL RELEASE;  Surgeon: Iran Planas, MD;  Location: [facility];  Service: Orthopedics;  Laterality: Right;  . FRACTURE SURGERY  2014   RIGHT METARAPAL FX- screws  . HAND SURGERY Left 2017   CARPAL  TUNNEL RELEASE  . KNEE ARTHROSCOPY WITH MEDIAL MENISECTOMY Right 11/01/2017   Procedure: KNEE ARTHROSCOPY WITH MEDIAL MENISECTOMY, CHONDROPLASTY;  Surgeon: Paralee Cancel, MD;  Location: [facility];  Service: Orthopedics;  Laterality: Right;  73min    Allergies  Allergen Reactions  . No Known Allergies     Social History   Socioeconomic History  . Marital status: Married    Spouse name: Not on file  . Number of children: Not on file  . Years of education: Not on file  . Highest education  level: Not on file  Occupational History  . Occupation: Programmer, applications rep    Employer: [employer]   Tobacco Use  . Smoking status: Never Smoker  . Smokeless tobacco: Never Used  Substance and Sexual Activity  . Alcohol use: Yes    Comment: 1 GLASS OF WINE PER NIGHT  . Drug use: No  . Sexual activity: Not on file  Other Topics Concern  . Not on file  Social History Narrative   Married (Dr. Veneda Melter) 3 kids   Social Determinants of Health   Financial Resource Strain:   . Difficulty of Paying Living Expenses: Not on file  Food Insecurity:   . Worried About Charity fundraiser in the Last Year: Not on file  . Ran Out of Food in the Last Year: Not on file  Transportation Needs:   . Lack of Transportation (Medical): Not on file  . Lack of Transportation (Non-Medical): Not on file  Physical Activity:   . Days of Exercise per Week: Not on file  . Minutes of Exercise per Session: Not on file  Stress:   . Feeling of Stress : Not on file  Social Connections:   . Frequency of Communication with Friends and Family: Not on file  . Frequency of Social Gatherings with Friends and Family: Not on file  . Attends Religious Services: Not on file  . Active Member of Clubs or Organizations: Not on file  . Attends Archivist Meetings: Not on file  . Marital Status: Not on file  Intimate Partner Violence:   . Fear of Current or Ex-Partner: Not on file  . Emotionally Abused: Not on file  . Physically Abused: Not on file  . Sexually Abused: Not on file       Family History  Problem Relation Age of Onset  . Heart disease Father   . Hypertension Father   . Hyperlipidemia Father   . Colon cancer Neg Hx   . Esophageal cancer Neg Hx   . Stomach cancer Neg Hx   . Rectal cancer Neg Hx        Review of Systems Constitutional: negative for anorexia, fevers and sweats  Eyes: negative for irritation, redness and visual disturbance  Ears, nose, mouth, throat, and face:  negative for earaches, epistaxis, nasal congestion and sore throat  Respiratory: negative for cough, dyspnea on exertion, sputum and wheezing  Cardiovascular: negative for chest pain, dyspnea, lower extremity edema, orthopnea, palpitations and syncope  Gastrointestinal: negative for abdominal pain, constipation, diarrhea, melena, nausea and vomiting  Genitourinary:negative for dysuria, frequency and hematuria  Hematologic/lymphatic: negative for bleeding, easy bruising and lymphadenopathy  Musculoskeletal:negative for arthralgias, muscle weakness and stiff joints  Neurological: negative for coordination problems, gait problems, headaches and weakness  Endocrine: negative for diabetic symptoms including polydipsia, polyuria and weight loss     Objective:   Physical Exam  Gen. Pleasant, well-nourished, in no distress, normal affect ENT -  no pallor,icterus, no post nasal drip Neck: No JVD, no thyromegaly, no carotid bruits Lungs: no use of accessory muscles, no dullness to percussion, clear without rales or rhonchi  Cardiovascular: Rhythm regular, heart sounds  normal, no murmurs or gallops, no peripheral edema Abdomen: soft and non-tender, no hepatosplenomegaly, BS normal. Musculoskeletal: No deformities, no cyanosis or clubbing Neuro:  alert, non focal       Assessment & Plan:

## 2019-01-17 ENCOUNTER — Encounter: Payer: Self-pay | Admitting: Internal Medicine

## 2019-01-17 DIAGNOSIS — Z860100 Personal history of colon polyps, unspecified: Secondary | ICD-10-CM

## 2019-01-17 DIAGNOSIS — R0609 Other forms of dyspnea: Secondary | ICD-10-CM | POA: Insufficient documentation

## 2019-01-17 HISTORY — DX: Personal history of colon polyps, unspecified: Z86.0100

## 2019-01-17 HISTORY — DX: Personal history of colonic polyps: Z86.010

## 2019-01-17 NOTE — Assessment & Plan Note (Signed)
Appears to be mild, reconfirmed on testing, does have a positional element, significantly worse in the supine position. We discussed cardiovascular implications of mild OSA.  More importantly he is very symptomatic, he has had some good results with CPAP with full facemask. We will check download to confirm that he is in correct settings and renew supplies as needed Long-term goal will be for weight loss  Weight loss encouraged, compliance with goal of at least 4-6 hrs every night is the expectation. Advised against medications with sedative side effects Cautioned against driving when sleepy - understanding that sleepiness will vary on a day to day basis

## 2019-01-17 NOTE — Assessment & Plan Note (Signed)
No clear cause identified on evaluation today Transient infiltrate on prior chest x-ray is likely of not any significance to his current symptom.  This does not seem to be related to asthma.  Transient wheezing especially nocturnal may be related to silent reflux or postnasal drip  He is not hearing this now on has CPAP therapy.  If recurrent, then could consider empiric treatment for reflux or for postnasal drip with decongestant in addition to Allegra. If dyspnea is persistent, will proceed with PFTs.  He does not seem to have any risk factors for VTE.  No cardiac etiology is apparent

## 2019-01-19 DIAGNOSIS — G4733 Obstructive sleep apnea (adult) (pediatric): Secondary | ICD-10-CM | POA: Diagnosis not present

## 2019-02-18 DIAGNOSIS — G4733 Obstructive sleep apnea (adult) (pediatric): Secondary | ICD-10-CM | POA: Diagnosis not present

## 2019-02-19 DIAGNOSIS — G4733 Obstructive sleep apnea (adult) (pediatric): Secondary | ICD-10-CM | POA: Diagnosis not present

## 2019-03-19 DIAGNOSIS — G4733 Obstructive sleep apnea (adult) (pediatric): Secondary | ICD-10-CM | POA: Diagnosis not present

## 2019-03-24 DIAGNOSIS — Z85828 Personal history of other malignant neoplasm of skin: Secondary | ICD-10-CM | POA: Diagnosis not present

## 2019-03-24 DIAGNOSIS — L2089 Other atopic dermatitis: Secondary | ICD-10-CM | POA: Diagnosis not present

## 2019-03-24 DIAGNOSIS — L720 Epidermal cyst: Secondary | ICD-10-CM | POA: Diagnosis not present

## 2019-03-24 DIAGNOSIS — L821 Other seborrheic keratosis: Secondary | ICD-10-CM | POA: Diagnosis not present

## 2019-04-02 ENCOUNTER — Encounter: Payer: Self-pay | Admitting: *Deleted

## 2019-04-02 NOTE — Progress Notes (Signed)
Pt requesting a letter stating when he got the flu vaccine

## 2019-04-03 DIAGNOSIS — Z111 Encounter for screening for respiratory tuberculosis: Secondary | ICD-10-CM | POA: Diagnosis not present

## 2019-04-15 ENCOUNTER — Encounter: Payer: Self-pay | Admitting: Sports Medicine

## 2019-04-15 ENCOUNTER — Telehealth: Payer: Self-pay | Admitting: Sports Medicine

## 2019-04-15 ENCOUNTER — Ambulatory Visit (INDEPENDENT_AMBULATORY_CARE_PROVIDER_SITE_OTHER): Payer: BC Managed Care – PPO | Admitting: Sports Medicine

## 2019-04-15 ENCOUNTER — Other Ambulatory Visit: Payer: Self-pay

## 2019-04-15 DIAGNOSIS — R635 Abnormal weight gain: Secondary | ICD-10-CM | POA: Diagnosis not present

## 2019-04-15 DIAGNOSIS — E669 Obesity, unspecified: Secondary | ICD-10-CM | POA: Insufficient documentation

## 2019-04-15 MED ORDER — PHENTERMINE HCL 37.5 MG PO TABS: ORAL_TABLET | ORAL | 0 refills | Status: DC

## 2019-04-15 NOTE — Telephone Encounter (Signed)
Received fax for [place] on Phentermine sent through cover my meds waiting on determination. - CF

## 2019-04-15 NOTE — Progress Notes (Signed)
    Procedures performed today:    None.  Independent interpretation of notes and tests performed by another provider:   None.  Brief History, Exam, Impression, and Recommendations:    Abnormal weight gain This is a pleasant 49 year old male, he has gained some weight, and is interested in losing. We discussed a multidisciplinary weight loss plan including an exercise prescription, low-carb diet as well as phentermine, we also discussed bupropion which could be an option in the future as well or we could potentially use it as dual therapy. Starting phentermine, return monthly for weight checks and refills.    ___________________________________________ [name]. Dianah Field, M.D., ABFM., CAQSM. Primary Care and [place] Instructor of [place] of [facility] of Medicine

## 2019-04-15 NOTE — Assessment & Plan Note (Signed)
This is a pleasant 49 year old male, he has gained some weight, and is interested in losing. We discussed a multidisciplinary weight loss plan including an exercise prescription, low-carb diet as well as phentermine, we also discussed bupropion which could be an option in the future as well or we could potentially use it as dual therapy. Starting phentermine, return monthly for weight checks and refills.

## 2019-04-17 DIAGNOSIS — Z23 Encounter for immunization: Secondary | ICD-10-CM | POA: Diagnosis not present

## 2019-04-19 DIAGNOSIS — G4733 Obstructive sleep apnea (adult) (pediatric): Secondary | ICD-10-CM | POA: Diagnosis not present

## 2019-04-22 NOTE — Telephone Encounter (Signed)
Received fax from [place] and they approved coverage on Phentermine HCL valid 04/15/19 - 07/15/19 - CF

## 2019-05-05 DIAGNOSIS — D23121 Other benign neoplasm of skin of left upper eyelid, including canthus: Secondary | ICD-10-CM | POA: Diagnosis not present

## 2019-05-13 ENCOUNTER — Encounter: Payer: Self-pay | Admitting: Sports Medicine

## 2019-05-13 ENCOUNTER — Other Ambulatory Visit: Payer: Self-pay

## 2019-05-13 ENCOUNTER — Ambulatory Visit (INDEPENDENT_AMBULATORY_CARE_PROVIDER_SITE_OTHER): Payer: BC Managed Care – PPO | Admitting: Sports Medicine

## 2019-05-13 DIAGNOSIS — R635 Abnormal weight gain: Secondary | ICD-10-CM

## 2019-05-13 MED ORDER — PHENTERMINE HCL 37.5 MG PO TABS: ORAL_TABLET | ORAL | 0 refills | Status: DC

## 2019-05-13 NOTE — Progress Notes (Signed)
    Procedures performed today:    None.  Independent interpretation of notes and tests performed by another provider:   None.  Brief History, Exam, Impression, and Recommendations:    Abnormal weight gain Ricky Hawkins returns, 10 pound weight loss after the first month on phentermine, but not yet at goal, no adverse effects. Refilling medication, entering the second month.    ___________________________________________ [name]. Dianah Field, M.D., ABFM., CAQSM. Primary Care and [place] Instructor of [place] of [facility] of Medicine

## 2019-05-13 NOTE — Assessment & Plan Note (Addendum)
Ricky Hawkins returns, 10 pound weight loss after the first month on phentermine, but not yet at goal, no adverse effects. Refilling medication, entering the second month.

## 2019-05-13 NOTE — Addendum Note (Signed)
Addended by: Silverio Decamp on: 05/13/2019 09:10 AM   Modules accepted: Orders

## 2019-05-19 DIAGNOSIS — G4733 Obstructive sleep apnea (adult) (pediatric): Secondary | ICD-10-CM | POA: Diagnosis not present

## 2019-06-10 ENCOUNTER — Encounter: Payer: Self-pay | Admitting: Sports Medicine

## 2019-06-10 ENCOUNTER — Other Ambulatory Visit: Payer: Self-pay

## 2019-06-10 ENCOUNTER — Ambulatory Visit (INDEPENDENT_AMBULATORY_CARE_PROVIDER_SITE_OTHER): Payer: BC Managed Care – PPO | Admitting: Sports Medicine

## 2019-06-10 DIAGNOSIS — R635 Abnormal weight gain: Secondary | ICD-10-CM | POA: Diagnosis not present

## 2019-06-10 MED ORDER — PHENTERMINE HCL 37.5 MG PO TABS: ORAL_TABLET | ORAL | 0 refills | Status: DC

## 2019-06-10 NOTE — Assessment & Plan Note (Addendum)
Ricky Hawkins returns, he is a pleasant 49 year old male, he has lost an additional 4 to 5 pounds since the last visit. He lost 10 pounds after the first month, we are entering the third month. He is about to go on a long hike with his son. I have advised him to avoid phentermine during this hike. This is a chronic problem not at goal with prescription drug management. Like to see him back in a month.

## 2019-06-10 NOTE — Progress Notes (Signed)
    Procedures performed today:    None.  Independent interpretation of notes and tests performed by another provider:   None.  Brief History, Exam, Impression, and Recommendations:    Abnormal weight gain Ricky Hawkins returns, he is a pleasant 49 year old male, he has lost an additional 4 to 5 pounds since the last visit. He lost 10 pounds after the first month, we are entering the third month. He is about to go on a long hike with his son. I have advised him to avoid phentermine during this hike. This is a chronic problem not at goal with prescription drug management. Like to see him back in a month.    ___________________________________________ [name]. Dianah Field, M.D., ABFM., CAQSM. Primary Care and [place] Instructor of [place] of [facility] of Medicine

## 2019-06-19 DIAGNOSIS — G4733 Obstructive sleep apnea (adult) (pediatric): Secondary | ICD-10-CM | POA: Diagnosis not present

## 2019-07-10 ENCOUNTER — Ambulatory Visit (INDEPENDENT_AMBULATORY_CARE_PROVIDER_SITE_OTHER): Payer: BC Managed Care – PPO | Admitting: Sports Medicine

## 2019-07-10 ENCOUNTER — Encounter: Payer: Self-pay | Admitting: Sports Medicine

## 2019-07-10 DIAGNOSIS — R635 Abnormal weight gain: Secondary | ICD-10-CM

## 2019-07-10 MED ORDER — PHENTERMINE HCL 37.5 MG PO TABS: ORAL_TABLET | ORAL | 0 refills | Status: DC

## 2019-07-10 MED FILL — PHENTERMINE 37.5 MG TABLET: 37.5 | 30 days supply | Qty: 30 | Fill #0

## 2019-07-10 NOTE — Assessment & Plan Note (Signed)
Ricky Hawkins returns, he is a pleasant 49 year old male, he has lost an additional 9 pounds since last visit, entering the fourth month, he did go on a long hike with his son. This went well. Goal weight continues to be 210 pounds, I would like him to start working on some resistance training putting on lean muscle mass.

## 2019-07-10 NOTE — Progress Notes (Signed)
    Procedures performed today:    None.  Independent interpretation of notes and tests performed by another provider:   None.  Brief History, Exam, Impression, and Recommendations:    Abnormal weight gain Ricky Hawkins returns, he is a pleasant 49 year old male, he has lost an additional 9 pounds since last visit, entering the fourth month, he did go on a long hike with his son. This went well. Goal weight continues to be 210 pounds, I would like him to start working on some resistance training putting on lean muscle mass.    ___________________________________________ [name]. Dianah Field, M.D., ABFM., CAQSM. Primary Care and [place] Instructor of [place] of [facility] of Medicine

## 2019-07-19 DIAGNOSIS — G4733 Obstructive sleep apnea (adult) (pediatric): Secondary | ICD-10-CM | POA: Diagnosis not present

## 2019-08-07 ENCOUNTER — Other Ambulatory Visit: Payer: Self-pay

## 2019-08-07 ENCOUNTER — Ambulatory Visit (INDEPENDENT_AMBULATORY_CARE_PROVIDER_SITE_OTHER): Payer: BC Managed Care – PPO | Admitting: Sports Medicine

## 2019-08-07 ENCOUNTER — Encounter: Payer: Self-pay | Admitting: Sports Medicine

## 2019-08-07 DIAGNOSIS — R635 Abnormal weight gain: Secondary | ICD-10-CM | POA: Diagnosis not present

## 2019-08-07 MED ORDER — PHENTERMINE HCL 37.5 MG PO TABS: ORAL_TABLET | ORAL | 0 refills | Status: DC

## 2019-08-07 NOTE — Progress Notes (Signed)
    Procedures performed today:    None.  Independent interpretation of notes and tests performed by another provider:   None.  Brief History, Exam, Impression, and Recommendations:    Abnormal weight gain [name] returns, he did gain about 5 pounds over the last month but he lost 9 pounds after his hike. 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 Entering the fifth month, after the 6 months of phentermine we will likely drop to a half tab for 3 months to give him some time to get used to the new lifestyle. This is a chronic process not quite at goal, goal weight is 210 pounds.    ___________________________________________ [name]. Dianah Field, M.D., ABFM., CAQSM. Primary Care and [place] Instructor of [place] of [facility] of Medicine

## 2019-08-07 NOTE — Assessment & Plan Note (Signed)
Ricky Hawkins returns, he did gain about 5 pounds over the last month but he lost 9 pounds after his hike. 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 Entering the fifth month, after the 6 months of phentermine we will likely drop to a half tab for 3 months to give him some time to get used to the new lifestyle. This is a chronic process not quite at goal, goal weight is 210 pounds.

## 2019-08-19 DIAGNOSIS — G4733 Obstructive sleep apnea (adult) (pediatric): Secondary | ICD-10-CM | POA: Diagnosis not present

## 2019-09-05 DIAGNOSIS — G4733 Obstructive sleep apnea (adult) (pediatric): Secondary | ICD-10-CM | POA: Diagnosis not present

## 2019-09-09 ENCOUNTER — Encounter: Payer: Self-pay | Admitting: Sports Medicine

## 2019-09-09 ENCOUNTER — Ambulatory Visit (INDEPENDENT_AMBULATORY_CARE_PROVIDER_SITE_OTHER): Payer: BC Managed Care – PPO | Admitting: Sports Medicine

## 2019-09-09 ENCOUNTER — Other Ambulatory Visit: Payer: Self-pay

## 2019-09-09 DIAGNOSIS — R635 Abnormal weight gain: Secondary | ICD-10-CM | POA: Diagnosis not present

## 2019-09-09 MED ORDER — PHENTERMINE HCL 37.5 MG PO TABS: ORAL_TABLET | ORAL | 0 refills | Status: DC

## 2019-09-09 MED ORDER — ACAMPROSATE CALCIUM 333 MG PO TBEC: 666.0000 mg | DELAYED_RELEASE_TABLET | Freq: Three times a day (TID) | ORAL | 11 refills | Status: DC

## 2019-09-09 NOTE — Progress Notes (Signed)
    Procedures performed today:    None.  Independent interpretation of notes and tests performed by another provider:   None.  Brief History, Exam, Impression, and Recommendations:    Abnormal weight gain [name] returns, we are now entering the 45-month of phentermine, he has maintained his weight unfortunately. We did advise that this is expected at the end of the phentermine treatment regimen. He has lost a total of 22 pounds which is a applaudable. He does have great insight into the situation, he will often drink a lot of beer, wine, often a bottle at night. He understands that these calories contribute to his lack of weight loss, we are going to add acamprosate to help decrease his desire for alcohol, I do not think this is severe enough yet to consider disulfiram/Antabuse. Refilling phentermine, return to see me in a month, we will likely drop him to a half dose at that time and do 3-6 additional months to taper off.    ___________________________________________ [name]. Dianah Field, M.D., ABFM., CAQSM. Primary Care and [place] Instructor of [place] of [facility] of Medicine

## 2019-09-09 NOTE — Assessment & Plan Note (Signed)
Ricky Hawkins returns, we are now entering the 56-month of phentermine, he has maintained his weight unfortunately. We did advise that this is expected at the end of the phentermine treatment regimen. He has lost a total of 22 pounds which is a applaudable. He does have great insight into the situation, he will often drink a lot of beer, wine, often a bottle at night. He understands that these calories contribute to his lack of weight loss, we are going to add acamprosate to help decrease his desire for alcohol, I do not think this is severe enough yet to consider disulfiram/Antabuse. Refilling phentermine, return to see me in a month, we will likely drop him to a half dose at that time and do 3-6 additional months to taper off.

## 2019-09-14 ENCOUNTER — Telehealth: Payer: Self-pay | Admitting: Internal Medicine

## 2019-09-14 DIAGNOSIS — Z8379 Family history of other diseases of the digestive system: Secondary | ICD-10-CM

## 2019-09-14 NOTE — Telephone Encounter (Signed)
Daughter has celiac disease Family testing request so will do labs

## 2019-09-19 DIAGNOSIS — G4733 Obstructive sleep apnea (adult) (pediatric): Secondary | ICD-10-CM | POA: Diagnosis not present

## 2019-09-24 ENCOUNTER — Other Ambulatory Visit (INDEPENDENT_AMBULATORY_CARE_PROVIDER_SITE_OTHER): Payer: BC Managed Care – PPO

## 2019-09-24 DIAGNOSIS — Z8379 Family history of other diseases of the digestive system: Secondary | ICD-10-CM | POA: Diagnosis not present

## 2019-09-24 LAB — IGA: IgA: 328 mg/dL (ref 68–378)

## 2019-09-25 LAB — TISSUE TRANSGLUTAMINASE, IGA: (tTG) Ab, IgA: <1 U/mL

## 2019-10-08 ENCOUNTER — Other Ambulatory Visit: Payer: BC Managed Care – PPO

## 2019-10-08 DIAGNOSIS — Z20822 Contact with and (suspected) exposure to covid-19: Secondary | ICD-10-CM | POA: Diagnosis not present

## 2019-10-09 LAB — SARS-COV-2, NAA 2 DAY TAT

## 2019-10-09 LAB — NOVEL CORONAVIRUS, NAA: SARS-CoV-2, NAA: NOT DETECTED

## 2019-10-18 DIAGNOSIS — Z03818 Encounter for observation for suspected exposure to other biological agents ruled out: Secondary | ICD-10-CM | POA: Diagnosis not present

## 2019-10-18 DIAGNOSIS — Z20822 Contact with and (suspected) exposure to covid-19: Secondary | ICD-10-CM | POA: Diagnosis not present

## 2019-10-30 ENCOUNTER — Other Ambulatory Visit: Payer: BC Managed Care – PPO

## 2019-10-30 DIAGNOSIS — Z20822 Contact with and (suspected) exposure to covid-19: Secondary | ICD-10-CM | POA: Diagnosis not present

## 2019-10-31 LAB — SARS-COV-2, NAA 2 DAY TAT

## 2019-10-31 LAB — NOVEL CORONAVIRUS, NAA: SARS-CoV-2, NAA: NOT DETECTED

## 2019-11-07 ENCOUNTER — Encounter: Payer: Self-pay | Admitting: Sports Medicine

## 2019-11-07 ENCOUNTER — Ambulatory Visit (INDEPENDENT_AMBULATORY_CARE_PROVIDER_SITE_OTHER): Payer: BC Managed Care – PPO | Admitting: Sports Medicine

## 2019-11-07 DIAGNOSIS — R635 Abnormal weight gain: Secondary | ICD-10-CM

## 2019-11-07 MED ORDER — PHENTERMINE HCL 37.5 MG PO TABS: ORAL_TABLET | ORAL | 0 refills | Status: DC

## 2019-11-07 NOTE — Assessment & Plan Note (Signed)
Ricky Hawkins returns, his weight has stabilized, we have finished 6 months of full dose phentermine and he has lost 23 pounds. He feels like he is gaining better control over his drinking, we did try acamprosate without much improvement. Certainly disulfiram remains an option but he does not feel as though its as big of an issue. I do not think he has developed an abuse problem. We did discuss his stresses and anxieties, overall mild so no need for medication. Refilling phentermine but half dose, we will do this for another 3-6 months. Return to see me in 3 months.

## 2019-11-07 NOTE — Progress Notes (Signed)
° ° °  Procedures performed today:    None.  Independent interpretation of notes and tests performed by another provider:   None.  Brief History, Exam, Impression, and Recommendations:    Abnormal weight gain Ricky Hawkins returns, his weight has stabilized, we have finished 6 months of full dose phentermine and he has lost 23 pounds. He feels like he is gaining better control over his drinking, we did try acamprosate without much improvement. Certainly disulfiram remains an option but he does not feel as though its as big of an issue. I do not think he has developed an abuse problem. We did discuss his stresses and anxieties, overall mild so no need for medication. Refilling phentermine but half dose, we will do this for another 3-6 months. Return to see me in 3 months.    ___________________________________________ [name]. Dianah Field, M.D., ABFM., CAQSM. Primary Care and [place] Instructor of [place] of [facility] of Medicine

## 2019-11-18 DIAGNOSIS — E291 Testicular hypofunction: Secondary | ICD-10-CM | POA: Diagnosis not present

## 2019-11-18 DIAGNOSIS — Z0000 Encounter for general adult medical examination without abnormal findings: Secondary | ICD-10-CM | POA: Diagnosis not present

## 2019-11-18 DIAGNOSIS — R5383 Other fatigue: Secondary | ICD-10-CM | POA: Diagnosis not present

## 2019-11-18 DIAGNOSIS — Z125 Encounter for screening for malignant neoplasm of prostate: Secondary | ICD-10-CM | POA: Diagnosis not present

## 2019-11-26 DIAGNOSIS — Z1212 Encounter for screening for malignant neoplasm of rectum: Secondary | ICD-10-CM | POA: Diagnosis not present

## 2019-11-26 DIAGNOSIS — F900 Attention-deficit hyperactivity disorder, predominantly inattentive type: Secondary | ICD-10-CM | POA: Diagnosis not present

## 2019-11-26 DIAGNOSIS — R82998 Other abnormal findings in urine: Secondary | ICD-10-CM | POA: Diagnosis not present

## 2019-11-26 DIAGNOSIS — Z0000 Encounter for general adult medical examination without abnormal findings: Secondary | ICD-10-CM | POA: Diagnosis not present

## 2019-12-25 ENCOUNTER — Other Ambulatory Visit: Payer: BC Managed Care – PPO

## 2019-12-25 DIAGNOSIS — Z20822 Contact with and (suspected) exposure to covid-19: Secondary | ICD-10-CM | POA: Diagnosis not present

## 2019-12-28 LAB — NOVEL CORONAVIRUS, NAA

## 2019-12-30 DIAGNOSIS — G4733 Obstructive sleep apnea (adult) (pediatric): Secondary | ICD-10-CM | POA: Diagnosis not present

## 2020-02-02 ENCOUNTER — Other Ambulatory Visit: Payer: BC Managed Care – PPO

## 2020-02-02 DIAGNOSIS — Z20822 Contact with and (suspected) exposure to covid-19: Secondary | ICD-10-CM

## 2020-02-03 LAB — NOVEL CORONAVIRUS, NAA: SARS-CoV-2, NAA: NOT DETECTED

## 2020-02-03 LAB — SARS-COV-2, NAA 2 DAY TAT

## 2020-02-06 ENCOUNTER — Ambulatory Visit: Payer: BC Managed Care – PPO | Admitting: Sports Medicine

## 2020-02-27 ENCOUNTER — Other Ambulatory Visit: Payer: Self-pay

## 2020-02-27 ENCOUNTER — Ambulatory Visit (INDEPENDENT_AMBULATORY_CARE_PROVIDER_SITE_OTHER): Payer: BC Managed Care – PPO | Admitting: Sports Medicine

## 2020-02-27 ENCOUNTER — Encounter: Payer: Self-pay | Admitting: Sports Medicine

## 2020-02-27 DIAGNOSIS — R635 Abnormal weight gain: Secondary | ICD-10-CM

## 2020-02-27 NOTE — Progress Notes (Signed)
    Procedures performed today:    None.  Independent interpretation of notes and tests performed by another provider:   None.  Brief History, Exam, Impression, and Recommendations:    Abnormal weight gain Ricky Hawkins returns, he really has not lost any weight, working to go ahead and discontinue phentermine, he will do this on his own. He understands to work hard on not drinking his calories. 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 I am happy to do his physicals under basic med for now.    ___________________________________________ [name]. Dianah Field, M.D., ABFM., CAQSM. Primary Care and [place] Instructor of [place] of [facility] of Medicine

## 2020-02-27 NOTE — Assessment & Plan Note (Signed)
[name] returns, he really has not lost any weight, working to go ahead and discontinue phentermine, he will do this on his own. He understands to work hard on not drinking his calories. 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 I am happy to do his physicals under basic med for now.

## 2020-04-26 ENCOUNTER — Other Ambulatory Visit: Payer: Self-pay | Admitting: *Deleted

## 2020-04-26 DIAGNOSIS — Z111 Encounter for screening for respiratory tuberculosis: Secondary | ICD-10-CM

## 2020-04-29 DIAGNOSIS — Z111 Encounter for screening for respiratory tuberculosis: Secondary | ICD-10-CM | POA: Diagnosis not present

## 2020-05-02 LAB — QUANTIFERON-TB GOLD PLUS
QuantiFERON Mitogen Value: >10 IU/mL
QuantiFERON Nil Value: 0.01 IU/mL
QuantiFERON TB1 Ag Value: 0.01 IU/mL
QuantiFERON TB2 Ag Value: 0.01 IU/mL
QuantiFERON-TB Gold Plus: NEGATIVE

## 2020-05-25 DIAGNOSIS — L918 Other hypertrophic disorders of the skin: Secondary | ICD-10-CM | POA: Diagnosis not present

## 2020-05-25 DIAGNOSIS — Z85828 Personal history of other malignant neoplasm of skin: Secondary | ICD-10-CM | POA: Diagnosis not present

## 2020-05-25 DIAGNOSIS — L814 Other melanin hyperpigmentation: Secondary | ICD-10-CM | POA: Diagnosis not present

## 2020-05-25 DIAGNOSIS — D225 Melanocytic nevi of trunk: Secondary | ICD-10-CM | POA: Diagnosis not present

## 2020-05-25 DIAGNOSIS — L821 Other seborrheic keratosis: Secondary | ICD-10-CM | POA: Diagnosis not present

## 2020-06-07 DIAGNOSIS — H02834 Dermatochalasis of left upper eyelid: Secondary | ICD-10-CM | POA: Diagnosis not present

## 2020-06-07 DIAGNOSIS — H02831 Dermatochalasis of right upper eyelid: Secondary | ICD-10-CM | POA: Diagnosis not present

## 2020-06-08 ENCOUNTER — Ambulatory Visit: Payer: BC Managed Care – PPO | Admitting: Pulmonary Disease

## 2020-06-28 ENCOUNTER — Ambulatory Visit: Payer: BC Managed Care – PPO | Attending: Critical Care Medicine

## 2020-06-28 DIAGNOSIS — Z20822 Contact with and (suspected) exposure to covid-19: Secondary | ICD-10-CM

## 2020-06-29 LAB — SPECIMEN STATUS REPORT

## 2020-06-29 LAB — NOVEL CORONAVIRUS, NAA: SARS-CoV-2, NAA: DETECTED — AB

## 2020-06-29 LAB — SARS-COV-2, NAA 2 DAY TAT

## 2020-07-13 ENCOUNTER — Ambulatory Visit (INDEPENDENT_AMBULATORY_CARE_PROVIDER_SITE_OTHER): Payer: BC Managed Care – PPO | Admitting: Pulmonary Disease

## 2020-07-13 ENCOUNTER — Encounter: Payer: Self-pay | Admitting: Pulmonary Disease

## 2020-07-13 ENCOUNTER — Other Ambulatory Visit: Payer: Self-pay

## 2020-07-13 DIAGNOSIS — G4733 Obstructive sleep apnea (adult) (pediatric): Secondary | ICD-10-CM

## 2020-07-13 NOTE — Assessment & Plan Note (Signed)
He has a large leak from your mask and a few residual events.  Download was reviewed which shows average pressure of 8 to 9 cm on auto settings 5 to 15 cm  Trial of -AirFit F30 fullface mask OR -Nasal mask with chinstrap to decrease leak.  We will recheck download in 2 to 3 months.  If persistent high AHI, may need formal titration study or consider changing to fixed level CPAP  We discussed alternative therapies including oral appliance and inspire device

## 2020-07-13 NOTE — Progress Notes (Signed)
   Subjective:    Patient ID: Ricky Hawkins, male    DOB: Mar 25, 1970, 50 y.o.   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  Snoring is stopped, he has noticed some leaks.  Settled down with full facemask.  He brings his machine today and we were able to obtain a download. No further complaints of dyspnea or wheezing   Significant tests/ events reviewed  HST 09/2011 AHI 12/h, lowest desat 78%-weight 233 pounds   NPSG 10/2018 wt 248 - TST 404 m, AHI 12/hour, worse in supine position  Review of Systems Patient denies significant dyspnea,cough, hemoptysis,  chest pain, palpitations, pedal edema, orthopnea, paroxysmal nocturnal dyspnea, lightheadedness, nausea, vomiting, abdominal or  leg pains      Objective:   Physical Exam  Gen. Pleasant, well-nourished, in no distress ENT - no thrush, no pallor/icterus,no post nasal drip Neck: No JVD, no thyromegaly, no carotid bruits Lungs: no use of accessory muscles, no dullness to percussion, clear without rales or rhonchi  Cardiovascular: Rhythm regular, heart sounds  normal, no murmurs or gallops, no peripheral edema Musculoskeletal: No deformities, no cyanosis or clubbing        Assessment & Plan:

## 2020-07-13 NOTE — Patient Instructions (Signed)
You have a large leak from your mask and a few residual events.  Trial of -AirFit F30 fullface mask OR -Nasal mask with chinstrap to decrease leak.  We will recheck download in 2 to 3 months

## 2020-08-01 ENCOUNTER — Ambulatory Visit (HOSPITAL_COMMUNITY)
Admission: EM | Admit: 2020-08-01 | Discharge: 2020-08-01 | Disposition: A | Discharge status: Home / Self Care | Payer: BC Managed Care – PPO | Attending: Family Medicine | Admitting: Family Medicine

## 2020-08-01 ENCOUNTER — Other Ambulatory Visit: Payer: Self-pay

## 2020-08-01 ENCOUNTER — Ambulatory Visit (INDEPENDENT_AMBULATORY_CARE_PROVIDER_SITE_OTHER): Payer: BC Managed Care – PPO

## 2020-08-01 ENCOUNTER — Encounter (HOSPITAL_COMMUNITY): Payer: Self-pay

## 2020-08-01 DIAGNOSIS — Z872 Personal history of diseases of the skin and subcutaneous tissue: Secondary | ICD-10-CM | POA: Diagnosis not present

## 2020-08-01 DIAGNOSIS — M79605 Pain in left leg: Secondary | ICD-10-CM

## 2020-08-01 DIAGNOSIS — R2242 Localized swelling, mass and lump, left lower limb: Secondary | ICD-10-CM

## 2020-08-01 DIAGNOSIS — R600 Localized edema: Secondary | ICD-10-CM | POA: Diagnosis not present

## 2020-08-01 DIAGNOSIS — M1712 Unilateral primary osteoarthritis, left knee: Secondary | ICD-10-CM | POA: Diagnosis not present

## 2020-08-01 MED ORDER — PREDNISONE 20 MG PO TABS: 40.0000 mg | ORAL_TABLET | Freq: Every day | ORAL | 0 refills | Status: DC

## 2020-08-01 MED ORDER — CEPHALEXIN 500 MG PO CAPS: 500.0000 mg | ORAL_CAPSULE | Freq: Two times a day (BID) | ORAL | 0 refills | Status: DC

## 2020-08-01 NOTE — ED Triage Notes (Signed)
Pt present left leg pain with swelling on the anterior part of the leg. Pt state walking on the leg is very painful and he had to get crutches to assist with standing and walking.

## 2020-08-01 NOTE — ED Provider Notes (Signed)
[place]    CSN: [id number] Arrival date & time: 08/01/20  1008      History   Chief Complaint Chief Complaint  Patient presents with   Leg Pain    HPI Ricky Hawkins is a 50 y.o. male.   Patient here today with about a week history of left lower anterior leg pain, swelling, redness.  States this started while he was in [place] and walking often throughout the day.  States it felt like shinsplints initially so he has been resting it, icing it and taking over-the-counter pain relievers.  States this is not helping significantly and his pain is still present and significant.  Denies numbness, tingling, direct injury, posterior calf pain or pain up higher into the leg.  Also denies chest pain, shortness of breath, palpitations, dizziness, history of coagulation disorders.    Past Medical History:  Diagnosis Date   Acute lateral meniscus tear of right knee    RIGHT KNEE   Allergy    GERD (gastroesophageal reflux disease)    Hx of colonic polyps - adenoma and ssp 01/17/2019   Pneumonia    Skin cancer, basal cell 2018   right calf and under nose   Sleep apnea    wears CPAP    Patient Active Problem List   Diagnosis Date Noted   Abnormal weight gain 04/15/2019   Dyspnea on exertion 01/17/2019   Hx of colonic polyps - adenoma and ssp 01/17/2019   Carpal tunnel syndrome, right 05/24/2015   Right ankle sprain 09/09/2014   Patellar tendinitis of left knee 11/19/2012   OSA (obstructive sleep apnea) 09/06/2011    Past Surgical History:  Procedure Laterality Date   CARPAL TUNNEL RELEASE Right 02/24/2016   Procedure: CARPAL TUNNEL RELEASE;  Surgeon: [place] Planas, MD;  Location: [place];  Service: Orthopedics;  Laterality: Right;   FRACTURE SURGERY  2014   RIGHT METARAPAL FX- screws   HAND SURGERY Left 2017   CARPAL TUNNEL RELEASE   KNEE ARTHROSCOPY WITH MEDIAL MENISECTOMY Right 11/01/2017   Procedure: KNEE ARTHROSCOPY WITH MEDIAL MENISECTOMY, CHONDROPLASTY;   Surgeon: Paralee Cancel, MD;  Location: [facility];  Service: Orthopedics;  Laterality: Right;  64mn       Home Medications    Prior to Admission medications   Medication Sig Start Date End Date Taking? Authorizing Provider  cephALEXin (KEFLEX) 500 MG capsule Take 1 capsule (500 mg total) by mouth 2 (two) times daily. 08/01/20  Yes LVolney American PA-C  predniSONE (DELTASONE) 20 MG tablet Take 2 tablets (40 mg total) by mouth daily with breakfast. 08/01/20  Yes LVolney American PA-C    Family History Family History  Problem Relation Age of Onset   Heart disease Father    Hypertension Father    Hyperlipidemia Father    Colon cancer Neg Hx    Esophageal cancer Neg Hx    Stomach cancer Neg Hx    Rectal cancer Neg Hx     Social History Social History   Tobacco Use   Smoking status: Never   Smokeless tobacco: Never  Vaping Use   Vaping Use: Never used  Substance Use Topics   Alcohol use: Yes    Comment: 1 GLASS OF WINE PER NIGHT   Drug use: No     Allergies   No known allergies   Review of Systems Review of Systems Per HPI  Physical Exam Triage Vital Signs ED Triage Vitals [08/01/20 1054]  Enc Vitals Group  BP (!) 139/92     Pulse Rate 67     Resp 18     Temp 98 F (36.7 C)     Temp Source Oral     SpO2 97 %     Weight      Height      Head Circumference      Peak Flow      Pain Score 1     Pain Loc      Pain Edu?      Excl. in [place]?    No data found.  Updated Vital Signs BP (!) 139/92 (BP Location: Right Arm)   Pulse 67   Temp 98 F (36.7 C) (Oral)   Resp 18   SpO2 97%   Visual Acuity Right Eye Distance:   Left Eye Distance:   Bilateral Distance:    Right Eye Near:   Left Eye Near:    Bilateral Near:     Physical Exam Vitals and nursing note reviewed.  Constitutional:      Appearance: Normal appearance.  HENT:     Head: Atraumatic.     Mouth/Throat:     Mouth: Mucous membranes are moist.  Eyes:      Extraocular Movements: Extraocular movements intact.     Conjunctiva/sclera: Conjunctivae normal.  Cardiovascular:     Rate and Rhythm: Normal rate and regular rhythm.     Pulses: Normal pulses.     Heart sounds: Normal heart sounds.  Pulmonary:     Effort: Pulmonary effort is normal.     Breath sounds: Normal breath sounds.  Musculoskeletal:        General: Swelling and tenderness present. No deformity or signs of injury. Normal range of motion.     Cervical back: Normal range of motion and neck supple.     Comments: Localized tenderness to palpation in a 4 to 5 inch linear vertical pattern of the left anterior lower leg from ankle.  States the pain is worse when he dorsiflexes his left foot.  Circumferential erythema and edema to about halfway up the lower leg with mild tenderness to palpation diffusely.  Skin:    General: Skin is warm and dry.     Findings: Erythema present. No bruising.  Neurological:     General: No focal deficit present.     Mental Status: He is oriented to person, place, and time.     Sensory: No sensory deficit.     Motor: No weakness.     Gait: Gait normal.     Comments: Left lower extremity neurovascularly intact  Psychiatric:        Mood and Affect: Mood normal.        Thought Content: Thought content normal.        Judgment: Judgment normal.     UC Treatments / Results  Labs (all labs ordered are listed, but only abnormal results are displayed) Labs Reviewed - No data to display  EKG   Radiology DG Tibia/Fibula Left  Result Date: 08/01/2020 CLINICAL DATA:  Left-sided pain, erythema, and edema for 7 days. No trauma. EXAM: LEFT TIBIA AND FIBULA - 2 VIEW COMPARISON:  None. FINDINGS: Subcutaneous edema diffusely. No acute fracture or dislocation. No osseous destruction. No soft tissue gas or radiopaque foreign body. Mild medial compartment osteoarthritis about the knee. Calcaneal spur. Degenerative changes the tibiotalar joint. IMPRESSION:  Subcutaneous edema, suggesting cellulitis. Electronically Signed   By: Abigail Miyamoto M.D.   On: 08/01/2020 11:55  Procedures Procedures (including critical care time)  Medications Ordered in UC Medications - No data to display  Initial Impression / Assessment and Plan / UC Course  I have reviewed the triage vital signs and the nursing notes.  Pertinent labs & imaging results that were available during my care of the patient were reviewed by me and considered in my medical decision making (see chart for details).  Clinical Course as of 08/01/20 1626  Sun Aug 01, 2020  1159 DG Tibia/Fibula Left [RL]    Clinical Course User Index [RL] Volney American, PA-C    Left tibia and fibula x-ray obtained this patient was concerned about stress fracture, this was negative for bony abnormality but showing edema in a pattern that was possibly concerning for cellulitis.  Though this may represent a tendinitis, inflammatory injury given the circumferential redness and swelling and persistence over the course of a week, will cover for infection with Keflex in addition to taking prednisone for inflammation.  Discussed RICE protocol additionally, DVT ultrasound ordered and information given to call in the morning to get this scheduled for rule out.  Knows to go to the emergency department if worsening at any point.  Final Clinical Impressions(s) / UC Diagnoses   Final diagnoses:  Left leg pain  Localized swelling of left lower leg     Discharge Instructions      Call [phone] to schedule your venous ultrasound during weekday business hours     ED Prescriptions     Medication Sig Dispense Auth. Provider   predniSONE (DELTASONE) 20 MG tablet Take 2 tablets (40 mg total) by mouth daily with breakfast. 10 tablet Volney American, PA-C   cephALEXin (KEFLEX) 500 MG capsule Take 1 capsule (500 mg total) by mouth 2 (two) times daily. 14 capsule Volney American, [place]       PDMP not reviewed this encounter.   Volney American, [place] 08/01/20 1626

## 2020-08-01 NOTE — Discharge Instructions (Addendum)
Call [phone] to schedule your venous ultrasound during weekday business hours

## 2020-09-14 DIAGNOSIS — Z8379 Family history of other diseases of the digestive system: Secondary | ICD-10-CM | POA: Diagnosis not present

## 2020-09-23 DIAGNOSIS — Z8379 Family history of other diseases of the digestive system: Secondary | ICD-10-CM | POA: Diagnosis not present

## 2020-10-04 DIAGNOSIS — Z23 Encounter for immunization: Secondary | ICD-10-CM | POA: Diagnosis not present

## 2021-01-11 DIAGNOSIS — E291 Testicular hypofunction: Secondary | ICD-10-CM | POA: Diagnosis not present

## 2021-01-11 DIAGNOSIS — Z125 Encounter for screening for malignant neoplasm of prostate: Secondary | ICD-10-CM | POA: Diagnosis not present

## 2021-01-11 DIAGNOSIS — R7989 Other specified abnormal findings of blood chemistry: Secondary | ICD-10-CM | POA: Diagnosis not present

## 2021-01-17 DIAGNOSIS — Z1331 Encounter for screening for depression: Secondary | ICD-10-CM | POA: Diagnosis not present

## 2021-01-17 DIAGNOSIS — R918 Other nonspecific abnormal finding of lung field: Secondary | ICD-10-CM | POA: Diagnosis not present

## 2021-01-17 DIAGNOSIS — Z0000 Encounter for general adult medical examination without abnormal findings: Secondary | ICD-10-CM | POA: Diagnosis not present

## 2021-02-02 ENCOUNTER — Telehealth: Payer: Self-pay | Admitting: Pulmonary Disease

## 2021-02-02 DIAGNOSIS — G4733 Obstructive sleep apnea (adult) (pediatric): Secondary | ICD-10-CM

## 2021-02-02 NOTE — Telephone Encounter (Signed)
Called and left voicemail for patient to call us back with the name of his DME to place order for supplies.

## 2021-02-04 NOTE — Telephone Encounter (Signed)
Spoke to patient, who is requesting a order for new cpap mask and supplies.  Our office did not originally prescribe machine. Current DME [facility].  Dr. Elsworth Soho, please advise if okay to order new mask. Thanks

## 2021-02-04 NOTE — Telephone Encounter (Signed)
Order placed.  Patient is aware and voiced his understanding.  Nothing further needed at this time.

## 2021-02-25 DIAGNOSIS — G4733 Obstructive sleep apnea (adult) (pediatric): Secondary | ICD-10-CM | POA: Diagnosis not present

## 2021-03-22 ENCOUNTER — Ambulatory Visit: Payer: BC Managed Care – PPO | Admitting: Pulmonary Disease

## 2021-03-25 DIAGNOSIS — G4733 Obstructive sleep apnea (adult) (pediatric): Secondary | ICD-10-CM | POA: Diagnosis not present

## 2021-04-06 ENCOUNTER — Other Ambulatory Visit: Payer: Self-pay | Admitting: Registered Nurse

## 2021-04-06 DIAGNOSIS — R1011 Right upper quadrant pain: Secondary | ICD-10-CM

## 2021-04-07 ENCOUNTER — Ambulatory Visit
Admission: RE | Admit: 2021-04-07 | Discharge: 2021-04-07 | Disposition: A | Discharge status: Home / Self Care | Payer: BC Managed Care – PPO | Source: Ambulatory Visit | Attending: Registered Nurse | Admitting: Registered Nurse

## 2021-04-07 DIAGNOSIS — R1011 Right upper quadrant pain: Secondary | ICD-10-CM | POA: Diagnosis not present

## 2021-04-25 DIAGNOSIS — G4733 Obstructive sleep apnea (adult) (pediatric): Secondary | ICD-10-CM | POA: Diagnosis not present

## 2021-04-26 ENCOUNTER — Ambulatory Visit: Payer: BC Managed Care – PPO | Admitting: Pulmonary Disease

## 2021-05-02 ENCOUNTER — Telehealth: Payer: Self-pay | Admitting: Internal Medicine

## 2021-05-02 DIAGNOSIS — R1011 Right upper quadrant pain: Secondary | ICD-10-CM

## 2021-05-02 MED ORDER — DICYCLOMINE HCL 20 MG PO TABS: 20.0000 mg | ORAL_TABLET | Freq: Three times a day (TID) | ORAL | 0 refills | Status: DC

## 2021-05-02 NOTE — Telephone Encounter (Signed)
Patient having abdominal pain ?I had discussed w/ him in early April (personal contact) ? ?See My Chart message - we reviewed today ? ?Plan ? ?1) Change appt to see me 5/9 7209 - this spot is open - he knows to come so do not need to call ? ? ?2) I have ordered labs he will do tomorrow ? ?3) Trial of dicyclomine also ordered ? ? ?

## 2021-05-03 ENCOUNTER — Other Ambulatory Visit (INDEPENDENT_AMBULATORY_CARE_PROVIDER_SITE_OTHER): Payer: BC Managed Care – PPO

## 2021-05-03 DIAGNOSIS — R1011 Right upper quadrant pain: Secondary | ICD-10-CM

## 2021-05-03 LAB — COMPREHENSIVE METABOLIC PANEL
ALT: 29 U/L (ref 0–53)
AST: 20 U/L (ref 0–37)
Albumin: 4.2 g/dL (ref 3.5–5.2)
Alkaline Phosphatase: 53 U/L (ref 39–117)
BUN: 17 mg/dL (ref 6–23)
CO2: 28 mEq/L (ref 19–32)
Calcium: 8.7 mg/dL (ref 8.4–10.5)
Chloride: 103 mEq/L (ref 96–112)
Creatinine, Ser: 1.12 mg/dL (ref 0.40–1.50)
GFR: 75.4 mL/min (ref >60.00)
Glucose, Bld: 94 mg/dL (ref 70–99)
Potassium: 4.6 mEq/L (ref 3.5–5.1)
Sodium: 137 mEq/L (ref 135–145)
Total Bilirubin: 0.8 mg/dL (ref 0.2–1.2)
Total Protein: 7.3 g/dL (ref 6.0–8.3)

## 2021-05-03 LAB — CBC
HCT: 40.6 % (ref 39.0–52.0)
Hemoglobin: 14.3 g/dL (ref 13.0–17.0)
MCHC: 35.1 g/dL (ref 30.0–36.0)
MCV: 86.3 fl (ref 78.0–100.0)
Platelets: 233 10*3/uL (ref 150.0–400.0)
RBC: 4.71 Mil/uL (ref 4.22–5.81)
RDW: 12.9 % (ref 11.5–15.5)
WBC: 5.9 10*3/uL (ref 4.0–10.5)

## 2021-05-03 LAB — LIPASE: Lipase: 45 U/L (ref 11.0–59.0)

## 2021-05-03 LAB — AMYLASE: Amylase: 52 U/L (ref 27–131)

## 2021-05-03 NOTE — Telephone Encounter (Signed)
Pt made aware that the previous appointment that Dr. Carlean Purl and him spoke about had got filled already: ?Pt did agree to the 05/06/2021 at 11:30:  Pt scheduled and made aware: ?Pt verbalized understanding with all questions answered.  ?  ?

## 2021-05-03 NOTE — Telephone Encounter (Signed)
5/9 0850 - this spot is not open  ?There is a 3:10 [facility] spot open: would you like for me to schedule pt for this and call pt and notify him:  ?

## 2021-05-03 NOTE — Telephone Encounter (Signed)
I meant for tomorrow 5/3 that was my mistake on the date.  Please tell him that the 850 for tomorrow got filled already. ? ?We could offer him 1130 this Friday or use one of the May 9 openings.   ? ? ?

## 2021-05-06 ENCOUNTER — Ambulatory Visit (INDEPENDENT_AMBULATORY_CARE_PROVIDER_SITE_OTHER): Payer: BC Managed Care – PPO | Admitting: Internal Medicine

## 2021-05-06 ENCOUNTER — Encounter: Payer: Self-pay | Admitting: Internal Medicine

## 2021-05-06 VITALS — BP 100/72 | HR 76 | Ht 73.5 in | Wt 234.0 lb

## 2021-05-06 DIAGNOSIS — R1013 Epigastric pain: Secondary | ICD-10-CM | POA: Diagnosis not present

## 2021-05-06 NOTE — Patient Instructions (Signed)
Take the dicyclomine regularly for about 2 weeks then try to stop it. ? ?Stay off the PPI. ? ?OK to re-introduce some coffee when ready. ? ?Contact me - if symptoms resume will likely do an EGD. ? ?I appreciate the opportunity to care for you. ?Gatha Mayer, MD, [facility] ? ?

## 2021-05-06 NOTE — Progress Notes (Signed)
? ?Ricky Hawkins 51 y.o. [birth year] [id number] ? ?Assessment & Plan:  ? ?Encounter Diagnosis  ?Name Primary?  ? Dyspepsia Yes  ? ? ?The patient is improved.  Plan is to continue dicyclomine for about 2 weeks regularly and then try to stop it.  I think he has had 6 weeks of PPI I would stop that.  Differential diagnosis includes gastritis, peptic ulcer disease functional disorder.  He can try to add coffee back in at his discretion but not as much as before.  If he gets recurrent symptoms we are planning on an EGD most likely and he will contact me. ? ?CC: Tisovec, Fransico Him, MD ? ? ?Subjective:  ? ?Chief Complaint: Right upper quadrant pain ? ?HPI ?Ricky Hawkins is a 51 year old white man known to me from prior screening colonoscopy he has a family history of celiac disease though his testing was negative in the past, and he has had several months of gnawing epigastric/right upper quadrant (more so) pain.  Food did not seem to make it better or worse necessarily.  It was aggravating.  He tried PPI without much help after talking to me about a month or so ago.  He has not lost weight.  He was concerned about what was going on he was traveling to [place] for spring break so primary care performed a right upper quadrant ultrasound that was negative.  I had him do labs recently and a CBC, CMET amylase and lipase were all normal on May 03, 2021.  He has not changed medications.  He has not noted an association with alcohol intake.  He has some job and family stress but nothing extraordinary at this time.  He did recently finish about 6 weeks of PPI initially some over-the-counter omeprazole and then some prescription pantoprazole. ? ? ?Wt Readings from Last 3 Encounters:  ?05/06/21 234 lb (106.1 kg)  ?07/13/20 235 lb (106.6 kg)  ?02/27/20 233 lb (105.7 kg)  ? ? ? ?Allergies  ?Allergen Reactions  ? No Known Allergies   ? ?Current Meds  ?Medication Sig  ? dicyclomine (BENTYL) 20 MG tablet Take 1 tablet (20 mg total) by mouth  3 (three) times daily before meals.  ? ?Past Medical History:  ?Diagnosis Date  ? Acute lateral meniscus tear of right knee   ? RIGHT KNEE  ? Allergy   ? GERD (gastroesophageal reflux disease)   ? Hx of colonic polyps - adenoma and ssp 01/17/2019  ? Pneumonia   ? Skin cancer, basal cell 2018  ? right calf and under nose  ? Sleep apnea   ? wears CPAP  ? ?Past Surgical History:  ?Procedure Laterality Date  ? CARPAL TUNNEL RELEASE Right 02/24/2016  ? Procedure: CARPAL TUNNEL RELEASE;  Surgeon: [place] Planas, MD;  Location: [facility];  Service: Orthopedics;  Laterality: Right;  ? COLONOSCOPY W/ POLYPECTOMY  2021  ? FRACTURE SURGERY  2014  ? RIGHT METARAPAL FX- screws  ? HAND SURGERY Left 2017  ? CARPAL TUNNEL RELEASE  ? KNEE ARTHROSCOPY WITH MEDIAL MENISECTOMY Right 11/01/2017  ? Procedure: KNEE ARTHROSCOPY WITH MEDIAL MENISECTOMY, CHONDROPLASTY;  Surgeon: Paralee Cancel, MD;  [facility];  Service: Orthopedics;  Laterality: Right;  75mn  ? ?Social History  ? ?Social History Narrative  ? Married (Dr. KVeneda Melter 3 kids  ? Medical device sales  ? ?family history includes Heart disease in his father; Hyperlipidemia in his father; Hypertension in his father. ? ? ?Review of Systems ? ?As per  HPI ?Objective:  ? Physical Exam ?'@BP'$  100/72   Pulse 76   Ht 6' 1.5" (1.867 m)   Wt 234 lb (106.1 kg)   BMI 30.45 kg/m? @ ? ?General:  NAD ?Eyes:   anicteric ?Lungs:  clear ?Heart::  S1S2 no rubs, murmurs or gallops ?Abdomen:  soft and nontender, BS+ ?Ext:   no edema, cyanosis or clubbing ? ? ? ?Data Reviewed:  ?See HPI ? ?

## 2021-05-13 ENCOUNTER — Ambulatory Visit: Payer: BC Managed Care – PPO | Admitting: Nurse Practitioner

## 2021-05-19 ENCOUNTER — Other Ambulatory Visit: Payer: Self-pay

## 2021-05-19 DIAGNOSIS — D2261 Melanocytic nevi of right upper limb, including shoulder: Secondary | ICD-10-CM | POA: Diagnosis not present

## 2021-05-19 DIAGNOSIS — Z111 Encounter for screening for respiratory tuberculosis: Secondary | ICD-10-CM | POA: Diagnosis not present

## 2021-05-19 DIAGNOSIS — L738 Other specified follicular disorders: Secondary | ICD-10-CM | POA: Diagnosis not present

## 2021-05-19 DIAGNOSIS — L570 Actinic keratosis: Secondary | ICD-10-CM | POA: Diagnosis not present

## 2021-05-19 DIAGNOSIS — Z85828 Personal history of other malignant neoplasm of skin: Secondary | ICD-10-CM | POA: Diagnosis not present

## 2021-05-19 DIAGNOSIS — L814 Other melanin hyperpigmentation: Secondary | ICD-10-CM | POA: Diagnosis not present

## 2021-05-19 NOTE — Progress Notes (Signed)
Quantiferon order placed per Dr.Pratt

## 2021-05-24 ENCOUNTER — Other Ambulatory Visit: Payer: Self-pay | Admitting: Internal Medicine

## 2021-05-24 LAB — QUANTIFERON-TB GOLD PLUS
QuantiFERON Mitogen Value: >10 IU/mL
QuantiFERON Nil Value: 0 IU/mL
QuantiFERON TB1 Ag Value: 0 IU/mL
QuantiFERON TB2 Ag Value: 0 IU/mL
QuantiFERON-TB Gold Plus: NEGATIVE

## 2021-06-06 ENCOUNTER — Telehealth: Payer: Self-pay | Admitting: Internal Medicine

## 2021-06-06 NOTE — Telephone Encounter (Signed)
Direct EGD for [facility] - clarification

## 2021-06-06 NOTE — Telephone Encounter (Signed)
Dr Carlean Purl is [place] ok?

## 2021-06-06 NOTE — Telephone Encounter (Signed)
Please contact patient and schedule a direct EGD for epigastric pain and dyspepsia

## 2021-06-07 ENCOUNTER — Encounter: Payer: Self-pay | Admitting: Internal Medicine

## 2021-06-09 NOTE — Telephone Encounter (Signed)
Chart reviewed. Pt previously scheduled for EGD on 07/12/2021 at 4:00 in the [facility] with Dr. Carlean Purl Pt scheduled for a previsit appointment on 07/06/2021 at 10:00 My chart message was sent to pt on 06/07/2021   Pt has read MyChart message

## 2021-06-13 ENCOUNTER — Other Ambulatory Visit: Payer: Self-pay | Admitting: Internal Medicine

## 2021-06-13 MED ORDER — DICYCLOMINE HCL 20 MG PO TABS: 20.0000 mg | ORAL_TABLET | Freq: Three times a day (TID) | ORAL | 0 refills | Status: DC

## 2021-07-06 ENCOUNTER — Ambulatory Visit (AMBULATORY_SURGERY_CENTER): Payer: BC Managed Care – PPO | Admitting: *Deleted

## 2021-07-06 ENCOUNTER — Other Ambulatory Visit: Payer: Self-pay | Admitting: Internal Medicine

## 2021-07-06 ENCOUNTER — Encounter: Payer: Self-pay | Admitting: Internal Medicine

## 2021-07-06 VITALS — Ht 74.0 in | Wt 230.0 lb

## 2021-07-06 DIAGNOSIS — R1013 Epigastric pain: Secondary | ICD-10-CM

## 2021-07-06 NOTE — Progress Notes (Addendum)
No egg or soy allergy known to patient  No issues known to pt with past sedation with any surgeries or procedures Patient denies ever being told they had issues or difficulty with intubation  No FH of Malignant Hyperthermia Pt is not on diet pills Pt is not on  home 02  Pt is not on blood thinners  Pt denies issues with constipation  No A fib or A flutter Have any cardiac testing pending--

## 2021-07-07 ENCOUNTER — Telehealth: Payer: Self-pay | Admitting: Internal Medicine

## 2021-07-07 DIAGNOSIS — R1013 Epigastric pain: Secondary | ICD-10-CM

## 2021-07-08 NOTE — Telephone Encounter (Signed)
Ambulatory order sent

## 2021-07-12 ENCOUNTER — Ambulatory Visit (AMBULATORY_SURGERY_CENTER): Payer: BC Managed Care – PPO | Admitting: Internal Medicine

## 2021-07-12 ENCOUNTER — Encounter: Payer: Self-pay | Admitting: Internal Medicine

## 2021-07-12 VITALS — BP 135/83 | HR 55 | Temp 98.0°F | Resp 16 | Ht 73.5 in

## 2021-07-12 DIAGNOSIS — R1013 Epigastric pain: Secondary | ICD-10-CM

## 2021-07-12 DIAGNOSIS — K3189 Other diseases of stomach and duodenum: Secondary | ICD-10-CM | POA: Diagnosis not present

## 2021-07-12 MED ORDER — SODIUM CHLORIDE 0.9 % IV SOLN: 500.0000 mL | Freq: Once | INTRAVENOUS | Status: DC

## 2021-07-12 NOTE — Patient Instructions (Addendum)
This looks ok - esophagus, stomach and duodenum appear normal. I took gastric and duodenal biopsies to see if that tells [place] anything.  I appreciate the opportunity to care for you. Gatha Mayer, MD, [facility]   Discharge instructions given. Biopsies taken. Resume previous medications. YOU HAD AN ENDOSCOPIC PROCEDURE TODAY AT [facility]:   Refer to the procedure report that was given to you for any specific questions about what was found during the examination.  If the procedure report does not answer your questions, please call your gastroenterologist to clarify.  If you requested that your care partner not be given the details of your procedure findings, then the procedure report has been included in a sealed envelope for you to review at your convenience later.  YOU SHOULD EXPECT: Some feelings of bloating in the abdomen. Passage of more gas than usual.  Walking can help get rid of the air that was put into your GI tract during the procedure and reduce the bloating. If you had a lower endoscopy (such as a colonoscopy or flexible sigmoidoscopy) you may notice spotting of blood in your stool or on the toilet paper. If you underwent a bowel prep for your procedure, you may not have a normal bowel movement for a few days.  Please Note:  You might notice some irritation and congestion in your nose or some drainage.  This is from the oxygen used during your procedure.  There is no need for concern and it should clear up in a day or so.  SYMPTOMS TO REPORT IMMEDIATELY:  Following upper endoscopy (EGD)  Vomiting of blood or coffee ground material  New chest pain or pain under the shoulder blades  Painful or persistently difficult swallowing  New shortness of breath  Fever of 100F or higher  Black, tarry-looking stools  For urgent or emergent issues, a gastroenterologist can be reached at any hour by calling [phone]. Do not use MyChart messaging for urgent concerns.     DIET:  We do recommend a small meal at first, but then you may proceed to your regular diet.  Drink plenty of fluids but you should avoid alcoholic beverages for 24 hours.  ACTIVITY:  You should plan to take it easy for the rest of today and you should NOT DRIVE or use heavy machinery until tomorrow (because of the sedation medicines used during the test).    FOLLOW UP: Our staff will call the number listed on your records the next business day following your procedure.  We will call around 7:15- 8:00 am to check on you and address any questions or concerns that you may have regarding the information given to you following your procedure. If we do not reach you, we will leave a message.  If you develop any symptoms (ie: fever, flu-like symptoms, shortness of breath, cough etc.) before then, please call [phone].  If you test positive for Covid 19 in the 2 weeks post procedure, please call and report this information to [place].    If any biopsies were taken you will be contacted by phone or by letter within the next 1-3 weeks.  Please call us at [phone] if you have not heard about the biopsies in 3 weeks.    SIGNATURES/CONFIDENTIALITY: You and/or your care partner have signed paperwork which will be entered into your electronic medical record.  These signatures attest to the fact that that the information above on your After Visit Summary has been reviewed and  is understood.  Full responsibility of the confidentiality of this discharge information lies with you and/or your care-partner.

## 2021-07-12 NOTE — Progress Notes (Signed)
Pt's states no medical or surgical changes since previsit or office visit. 

## 2021-07-12 NOTE — Progress Notes (Signed)
Pt in recovery with monitors in place, VSS. Report given to receiving RN. Bite guard was placed with pt awake to ensure comfort. No dental or soft tissue damage noted. 

## 2021-07-12 NOTE — Op Note (Signed)
[facility] Patient Name: Ricky Hawkins Procedure Date: 07/12/2021 4:23 PM MRN: [id number] Endoscopist: Gatha Mayer , MD Age: 51 Referring MD:  Date of Birth: 1970/09/10 Gender: Male Account #: [id number] Procedure:                Upper GI endoscopy Indications:              Dyspepsia despite PPI Medicines:                Monitored Anesthesia Care Procedure:                Pre-Anesthesia Assessment:                           - Prior to the procedure, a History and Physical                            was performed, and patient medications and                            allergies were reviewed. The patient's tolerance of                            previous anesthesia was also reviewed. The risks                            and benefits of the procedure and the sedation                            options and risks were discussed with the patient.                            All questions were answered, and informed consent                            was obtained. Prior Anticoagulants: The patient has                            taken no previous anticoagulant or antiplatelet                            agents. ASA Grade Assessment: II - A patient with                            mild systemic disease. After reviewing the risks                            and benefits, the patient was deemed in                            satisfactory condition to undergo the procedure.                           After obtaining informed consent, the endoscope was  passed under direct vision. Throughout the                            procedure, the patient's blood pressure, pulse, and                            oxygen saturations were monitored continuously. The                            Endoscope was introduced through the mouth, and                            advanced to the second part of duodenum. The upper                            GI endoscopy was accomplished without  difficulty.                            The patient tolerated the procedure well. Scope In: Scope Out: Findings:                 The examined esophagus was normal.                           The entire examined stomach was normal.                           The cardia and gastric fundus were normal on                            retroflexion.                           The examined duodenum was normal.                           Biopsies were taken with a cold forceps in the                            gastric body and in the gastric antrum for                            histology. Biopsies for histology were taken with a                            cold forceps in the entire duodenum for evaluation                            of celiac disease. Verification of patient                            identification for the specimen was done. Estimated                            blood loss was minimal. Complications:  No immediate complications. Estimated Blood Loss:     Estimated blood loss was minimal. Impression:               - Normal esophagus.                           - Normal stomach.                           - Normal examined duodenum.                           - Biopsies were taken with a cold forceps for                            histology in the gastric body and in the gastric                            antrum.                           - Biopsies were taken with a cold forceps for                            evaluation of celiac disease. Recommendation:           - Patient has a contact number available for                            emergencies. The signs and symptoms of potential                            delayed complications were discussed with the                            patient. Return to normal activities tomorrow.                            Written discharge instructions were provided to the                            patient.                           - Resume  previous diet.                           - Continue present medications.                           - Await pathology results. Gatha Mayer, MD 07/12/2021 4:52:48 PM This report has been signed electronically.

## 2021-07-12 NOTE — Progress Notes (Signed)
[facility] History and Physical   Primary Care Physician:  Tisovec, Fransico Him, MD   Reason for Procedure:   dyspepsia  Plan:    EGD     HPI: Ricky Hawkins is a 51 y.o. male here to evaluate dyspepsia despite PPI Tx  Wt Readings from Last 3 Encounters:  07/06/21 230 lb (104.3 kg)  05/06/21 234 lb (106.1 kg)  07/13/20 235 lb (106.6 kg)    Past Medical History:  Diagnosis Date   Acute lateral meniscus tear of right knee    RIGHT KNEE   Allergy    GERD (gastroesophageal reflux disease)    Hx of colonic polyps - adenoma and ssp 01/17/2019   Pneumonia    Skin cancer, basal cell 2018   right calf and under nose   Sleep apnea    wears CPAP    Past Surgical History:  Procedure Laterality Date   CARPAL TUNNEL RELEASE Right 02/24/2016   Procedure: CARPAL TUNNEL RELEASE;  Surgeon: Iran Planas, MD;  Location: [facility];  Service: Orthopedics;  Laterality: Right;   CARPAL TUNNEL RELEASE Left    2017   COLONOSCOPY W/ POLYPECTOMY  2021   FRACTURE SURGERY  2014   RIGHT METARAPAL FX- screws   HAND SURGERY Left 2017   CARPAL TUNNEL RELEASE   KNEE ARTHROSCOPY WITH MEDIAL MENISECTOMY Right 11/01/2017   Procedure: KNEE ARTHROSCOPY WITH MEDIAL MENISECTOMY, CHONDROPLASTY;  Surgeon: Paralee Cancel, MD;  Location: [facility];  Service: Orthopedics;  Laterality: Right;  53mn    Prior to Admission medications   Medication Sig Start Date End Date Taking? Authorizing Provider  dicyclomine (BENTYL) 20 MG tablet TAKE 1 TABLET (20 MG TOTAL) BY MOUTH 3 (THREE) TIMES DAILY BEFORE MEALS. 07/06/21  Yes GGatha Mayer MD  pantoprazole (PROTONIX) 40 MG tablet Take 40 mg by mouth daily. 05/04/21  Yes [provider]    Current Outpatient Medications  Medication Sig Dispense Refill   dicyclomine (BENTYL) 20 MG tablet TAKE 1 TABLET (20 MG TOTAL) BY MOUTH 3 (THREE) TIMES DAILY BEFORE MEALS. 90 tablet 0   pantoprazole (PROTONIX) 40 MG tablet Take 40 mg by mouth  daily.     Current Facility-Administered Medications  Medication Dose Route Frequency Provider Last Rate Last Admin   0.9 %  sodium chloride infusion  500 mL Intravenous Once GGatha Mayer MD        Allergies as of 07/12/2021 - Review Complete 07/12/2021  Allergen Reaction Noted   No known allergies  02/23/2016    Family History  Problem Relation Age of Onset   Heart disease Father    Hypertension Father    Hyperlipidemia Father    Colon cancer Neg Hx    Esophageal cancer Neg Hx    Stomach cancer Neg Hx    Rectal cancer Neg Hx    Crohn's disease Neg Hx    Colon polyps Neg Hx     Social History   Socioeconomic History   Marital status: Married    Spouse name: Not on file   Number of children: Not on file   Years of education: Not on file   Highest education level: Not on file  Occupational History   Occupation: medical sales rep    Employer: [employer]   Tobacco Use   Smoking status: Never    Passive exposure: Never   Smokeless tobacco: Never  Vaping Use   Vaping Use: Never used  Substance and Sexual Activity   Alcohol  use: Yes    Comment: 1 GLASS OF WINE PER NIGHT   Drug use: No   Sexual activity: Not on file  Other Topics Concern   Not on file  Social History Narrative   Married (Dr. Veneda Melter) 3 kids   Medical device sales   Social Determinants of Health   Financial Resource Strain: Not on file  Food Insecurity: Not on file  Transportation Needs: Not on file  Physical Activity: Not on file  Stress: Not on file  Social Connections: Not on file  Intimate Partner Violence: Not on file    Review of Systems:  All other review of systems negative except as mentioned in the HPI.  Physical Exam: Vital signs BP (!) 146/80   Pulse (!) 55   Temp 98 F (36.7 C)   Resp 12   Ht 6' 1.5" (1.867 m)   SpO2 100%   BMI 29.93 kg/m   General:   Alert,  Well-developed, well-nourished, pleasant and cooperative in NAD Lungs:  Clear throughout to  auscultation.   Heart:  Regular rate and rhythm; no murmurs, clicks, rubs,  or gallops. Abdomen:  Soft, nontender and nondistended. Normal bowel sounds.   Neuro/Psych:  Alert and cooperative. Normal mood and affect. A and O x 3   '@Shrika Milos'$  Simonne Maffucci, MD, [facility] [phone] (pager) 07/12/2021 4:32 PM@

## 2021-07-12 NOTE — Progress Notes (Signed)
Called to room to assist during endoscopic procedure.  Patient ID and intended procedure confirmed with present staff. Received instructions for my participation in the procedure from the performing physician.  

## 2021-07-18 ENCOUNTER — Encounter: Payer: Self-pay | Admitting: Internal Medicine

## 2021-07-19 ENCOUNTER — Other Ambulatory Visit: Payer: Self-pay

## 2021-07-19 DIAGNOSIS — R1013 Epigastric pain: Secondary | ICD-10-CM

## 2021-07-22 DIAGNOSIS — G4733 Obstructive sleep apnea (adult) (pediatric): Secondary | ICD-10-CM | POA: Diagnosis not present

## 2021-07-26 ENCOUNTER — Telehealth: Payer: Self-pay

## 2021-07-26 NOTE — Telephone Encounter (Signed)
Spoke with Bonnita Nasuti from [facility] [phone]) & she stated patient's wife is a doctor at their office, and would prefer patient to have CT scan at their office. [facility] notified of changes & has confirmed that it is okay to reschedule at a different location, and once it has been schedule pre-auth team will start to work on insurance.

## 2021-07-29 ENCOUNTER — Ambulatory Visit (INDEPENDENT_AMBULATORY_CARE_PROVIDER_SITE_OTHER): Payer: BC Managed Care – PPO

## 2021-07-29 DIAGNOSIS — R1010 Upper abdominal pain, unspecified: Secondary | ICD-10-CM | POA: Diagnosis not present

## 2021-07-29 DIAGNOSIS — R1013 Epigastric pain: Secondary | ICD-10-CM | POA: Diagnosis not present

## 2021-07-29 DIAGNOSIS — R1011 Right upper quadrant pain: Secondary | ICD-10-CM | POA: Diagnosis not present

## 2021-07-29 DIAGNOSIS — K449 Diaphragmatic hernia without obstruction or gangrene: Secondary | ICD-10-CM | POA: Diagnosis not present

## 2021-07-29 MED ORDER — IOHEXOL 300 MG/ML  SOLN
100.0000 mL | Freq: Once | INTRAMUSCULAR | Status: AC | PRN
  Administered 2021-07-29: 100 mL via INTRAVENOUS

## 2021-08-01 ENCOUNTER — Other Ambulatory Visit: Payer: Self-pay | Admitting: Internal Medicine

## 2021-08-02 ENCOUNTER — Other Ambulatory Visit (HOSPITAL_COMMUNITY): Payer: BC Managed Care – PPO

## 2021-08-08 ENCOUNTER — Other Ambulatory Visit: Payer: Self-pay | Admitting: Internal Medicine

## 2021-08-21 ENCOUNTER — Other Ambulatory Visit: Payer: Self-pay | Admitting: Internal Medicine

## 2021-08-22 DIAGNOSIS — G4733 Obstructive sleep apnea (adult) (pediatric): Secondary | ICD-10-CM | POA: Diagnosis not present

## 2021-09-22 DIAGNOSIS — G4733 Obstructive sleep apnea (adult) (pediatric): Secondary | ICD-10-CM | POA: Diagnosis not present

## 2021-11-10 ENCOUNTER — Ambulatory Visit (INDEPENDENT_AMBULATORY_CARE_PROVIDER_SITE_OTHER): Payer: BC Managed Care – PPO | Admitting: Sports Medicine

## 2021-11-10 ENCOUNTER — Ambulatory Visit (INDEPENDENT_AMBULATORY_CARE_PROVIDER_SITE_OTHER): Payer: BC Managed Care – PPO

## 2021-11-10 VITALS — Ht 74.0 in | Wt 235.0 lb

## 2021-11-10 DIAGNOSIS — M5412 Radiculopathy, cervical region: Secondary | ICD-10-CM

## 2021-11-10 DIAGNOSIS — M47812 Spondylosis without myelopathy or radiculopathy, cervical region: Secondary | ICD-10-CM | POA: Diagnosis not present

## 2021-11-10 DIAGNOSIS — E669 Obesity, unspecified: Secondary | ICD-10-CM

## 2021-11-10 MED ORDER — NAPROXEN SODIUM 220 MG PO TABS: 440.0000 mg | ORAL_TABLET | Freq: Two times a day (BID) | ORAL | Status: AC

## 2021-11-10 MED ORDER — PREDNISONE 50 MG PO TABS: ORAL_TABLET | ORAL | 0 refills | Status: DC

## 2021-11-10 NOTE — Progress Notes (Addendum)
    Procedures performed today:    None.  Independent interpretation of notes and tests performed by another provider:   None.  Brief History, Exam, Impression, and Recommendations:    Radiculitis of left cervical region This is a very pleasant 51 year old male, he has been having numbness and tingling from the deltoid down to the thumb. He is post carpal tunnel release bilaterally. We discussed the anatomy and pathophysiology, adding prednisone, after prednisone he can return to Aleve 2 tabs twice a day for 2 weeks. Adding x-rays, home physical therapy, we discussed ergonomics and posture. Return to see me in 6 weeks, MRI if no better.  Obesity (BMI 30-39.9) [name] has obesity, he does have a comorbidity of sleep apnea. He has tried multiple weight loss modalities including diet, exercise and other medications without success, he will be enrolled in a multidisciplinary weight loss program. Adding QRFXJO.    ____________________________________________ [name]. Dianah Field, M.D., ABFM., CAQSM., AME. Primary Care and Sports Medicine [facility]  Adjunct Professor of [place] of [facility] of Medicine  Risk manager

## 2021-11-10 NOTE — Assessment & Plan Note (Signed)
This is a very pleasant 51 year old male, he has been having numbness and tingling from the deltoid down to the thumb. He is post carpal tunnel release bilaterally. We discussed the anatomy and pathophysiology, adding prednisone, after prednisone he can return to Aleve 2 tabs twice a day for 2 weeks. Adding x-rays, home physical therapy, we discussed ergonomics and posture. Return to see me in 6 weeks, MRI if no better.

## 2021-11-28 ENCOUNTER — Other Ambulatory Visit: Payer: Self-pay | Admitting: Internal Medicine

## 2021-12-01 DIAGNOSIS — G4733 Obstructive sleep apnea (adult) (pediatric): Secondary | ICD-10-CM | POA: Diagnosis not present

## 2021-12-12 ENCOUNTER — Encounter: Payer: Self-pay | Admitting: Sports Medicine

## 2021-12-12 DIAGNOSIS — E669 Obesity, unspecified: Secondary | ICD-10-CM

## 2021-12-12 MED ORDER — WEGOVY 0.25 MG/0.5ML ~~LOC~~ SOAJ: 0.2500 mg | SUBCUTANEOUS | 0 refills | Status: DC

## 2021-12-12 NOTE — Assessment & Plan Note (Signed)
Ricky Hawkins has obesity, he does have a comorbidity of sleep apnea. He has tried multiple weight loss modalities including diet, exercise and other medications without success, he will be enrolled in a multidisciplinary weight loss program. Adding PRFFMB.

## 2021-12-12 NOTE — Assessment & Plan Note (Signed)
Ricky Hawkins has obesity, he does have a comorbidity of sleep apnea. He has tried multiple weight loss modalities including diet, exercise and other medications without success, he will be enrolled in a multidisciplinary weight loss program. Adding BSJGGE.

## 2021-12-22 ENCOUNTER — Telehealth: Payer: Self-pay | Admitting: Sports Medicine

## 2021-12-22 ENCOUNTER — Telehealth: Payer: Self-pay

## 2021-12-22 ENCOUNTER — Ambulatory Visit (INDEPENDENT_AMBULATORY_CARE_PROVIDER_SITE_OTHER): Payer: BC Managed Care – PPO | Admitting: Sports Medicine

## 2021-12-22 DIAGNOSIS — E669 Obesity, unspecified: Secondary | ICD-10-CM

## 2021-12-22 DIAGNOSIS — M5412 Radiculopathy, cervical region: Secondary | ICD-10-CM

## 2021-12-22 NOTE — Addendum Note (Signed)
Addended by: Silverio Decamp on: 12/22/2021 09:59 AM   Modules accepted: Orders

## 2021-12-22 NOTE — Telephone Encounter (Addendum)
Initiated Prior authorization SWN:IOEVOJ 0.'25MG'$ /0.5ML auto-injectors Via: Covermymeds Case/Key:[id number] Status: denied  as of 12/22/21 Reason:*Drug Not Covered/Plan Exclusion - Your request for coverage was denied because your prescription benefit plan does not cover the requested medication. This decision relates specifically to coverage provided under your prescription benefit plan and does not involve any determination of medical judgment. Notified Pt via: Mychart

## 2021-12-22 NOTE — Assessment & Plan Note (Signed)
Ricky Hawkins returns, he is a pleasant 51 year old male, numbness and tingling deltoid down to the thumb, status post carpal tunnel release bilaterally, he did well after course of prednisone and Aleve 2 tabs twice a day, home physical therapy though he can get better with his consistency. At this point he is happy with his results, he will let me know if he would like to get an MRI done before the end of the year.

## 2021-12-22 NOTE — Telephone Encounter (Signed)
Hi!  Any word about his wegovy PA?

## 2021-12-22 NOTE — Progress Notes (Signed)
    Procedures performed today:    None.  Independent interpretation of notes and tests performed by another provider:   None.  Brief History, Exam, Impression, and Recommendations:    Radiculitis of left cervical region Ricky Hawkins, he is a pleasant 51 year old male, numbness and tingling deltoid down to the thumb, status post carpal tunnel release bilaterally, he did well after course of prednisone and Aleve 2 tabs twice a day, home physical therapy though he can get better with his consistency. At this point he is happy with his results, he will let me know if he would like to get an MRI done before the end of the year.  Obesity (BMI 30-39.9) Have not yet heard back about Wegovy, we will send a message to our prior authorization coordinator.    ____________________________________________ [name]. Dianah Field, M.D., ABFM., CAQSM., AME. Primary Care and Sports Medicine [facility]  Adjunct Professor of [place] of [facility] of Medicine  Risk manager

## 2021-12-22 NOTE — Assessment & Plan Note (Signed)
Have not yet heard back about Wegovy, we will send a message to our prior authorization coordinator.

## 2021-12-27 ENCOUNTER — Other Ambulatory Visit (HOSPITAL_BASED_OUTPATIENT_CLINIC_OR_DEPARTMENT_OTHER): Payer: Self-pay

## 2021-12-27 MED ORDER — COMIRNATY 30 MCG/0.3ML IM SUSY
PREFILLED_SYRINGE | INTRAMUSCULAR | 0 refills | Status: AC
  Filled 2021-12-27: qty 0.3, 1d supply, fill #0

## 2021-12-30 ENCOUNTER — Encounter: Payer: Self-pay | Admitting: Sports Medicine

## 2021-12-30 DIAGNOSIS — E669 Obesity, unspecified: Secondary | ICD-10-CM

## 2021-12-30 MED ORDER — SEMAGLUTIDE (2 MG/DOSE) 8 MG/3ML ~~LOC~~ SOPN: PEN_INJECTOR | SUBCUTANEOUS | 3 refills | Status: DC

## 2021-12-31 DIAGNOSIS — G4733 Obstructive sleep apnea (adult) (pediatric): Secondary | ICD-10-CM | POA: Diagnosis not present

## 2022-01-24 DIAGNOSIS — R7989 Other specified abnormal findings of blood chemistry: Secondary | ICD-10-CM | POA: Diagnosis not present

## 2022-01-24 DIAGNOSIS — Z125 Encounter for screening for malignant neoplasm of prostate: Secondary | ICD-10-CM | POA: Diagnosis not present

## 2022-01-24 DIAGNOSIS — Z1212 Encounter for screening for malignant neoplasm of rectum: Secondary | ICD-10-CM | POA: Diagnosis not present

## 2022-01-24 DIAGNOSIS — E291 Testicular hypofunction: Secondary | ICD-10-CM | POA: Diagnosis not present

## 2022-01-24 DIAGNOSIS — K219 Gastro-esophageal reflux disease without esophagitis: Secondary | ICD-10-CM | POA: Diagnosis not present

## 2022-01-31 DIAGNOSIS — Z1331 Encounter for screening for depression: Secondary | ICD-10-CM | POA: Diagnosis not present

## 2022-01-31 DIAGNOSIS — R82998 Other abnormal findings in urine: Secondary | ICD-10-CM | POA: Diagnosis not present

## 2022-01-31 DIAGNOSIS — Z1339 Encounter for screening examination for other mental health and behavioral disorders: Secondary | ICD-10-CM | POA: Diagnosis not present

## 2022-01-31 DIAGNOSIS — G4733 Obstructive sleep apnea (adult) (pediatric): Secondary | ICD-10-CM | POA: Diagnosis not present

## 2022-01-31 DIAGNOSIS — F900 Attention-deficit hyperactivity disorder, predominantly inattentive type: Secondary | ICD-10-CM | POA: Diagnosis not present

## 2022-01-31 DIAGNOSIS — Z0000 Encounter for general adult medical examination without abnormal findings: Secondary | ICD-10-CM | POA: Diagnosis not present

## 2022-02-23 ENCOUNTER — Other Ambulatory Visit: Payer: Self-pay | Admitting: Internal Medicine

## 2022-05-16 DIAGNOSIS — L814 Other melanin hyperpigmentation: Secondary | ICD-10-CM | POA: Diagnosis not present

## 2022-05-16 DIAGNOSIS — L821 Other seborrheic keratosis: Secondary | ICD-10-CM | POA: Diagnosis not present

## 2022-05-16 DIAGNOSIS — Z85828 Personal history of other malignant neoplasm of skin: Secondary | ICD-10-CM | POA: Diagnosis not present

## 2022-05-16 DIAGNOSIS — D225 Melanocytic nevi of trunk: Secondary | ICD-10-CM | POA: Diagnosis not present

## 2022-05-16 DIAGNOSIS — L570 Actinic keratosis: Secondary | ICD-10-CM | POA: Diagnosis not present

## 2022-05-31 DIAGNOSIS — G4733 Obstructive sleep apnea (adult) (pediatric): Secondary | ICD-10-CM | POA: Diagnosis not present

## 2022-06-12 ENCOUNTER — Other Ambulatory Visit: Payer: Self-pay | Admitting: Sports Medicine

## 2022-06-12 DIAGNOSIS — E669 Obesity, unspecified: Secondary | ICD-10-CM

## 2022-06-12 MED ORDER — SEMAGLUTIDE (2 MG/DOSE) 8 MG/3ML ~~LOC~~ SOPN
PEN_INJECTOR | SUBCUTANEOUS | 11 refills | Status: AC
Start: 2022-06-12 — End: ?

## 2022-07-01 DIAGNOSIS — G4733 Obstructive sleep apnea (adult) (pediatric): Secondary | ICD-10-CM | POA: Diagnosis not present

## 2022-07-25 ENCOUNTER — Encounter: Payer: Self-pay | Admitting: Sports Medicine

## 2022-07-25 DIAGNOSIS — Z111 Encounter for screening for respiratory tuberculosis: Secondary | ICD-10-CM

## 2022-07-26 DIAGNOSIS — Z111 Encounter for screening for respiratory tuberculosis: Secondary | ICD-10-CM | POA: Diagnosis not present

## 2022-07-28 LAB — QUANTIFERON-TB GOLD PLUS
QuantiFERON Mitogen Value: 1.17 IU/mL
QuantiFERON Nil Value: 0.04 [IU]/mL
QuantiFERON TB1 Ag Value: 0.05 [IU]/mL
QuantiFERON TB2 Ag Value: 0.02 [IU]/mL
QuantiFERON-TB Gold Plus: NEGATIVE

## 2022-07-31 DIAGNOSIS — G4733 Obstructive sleep apnea (adult) (pediatric): Secondary | ICD-10-CM | POA: Diagnosis not present

## 2022-09-01 DIAGNOSIS — G4733 Obstructive sleep apnea (adult) (pediatric): Secondary | ICD-10-CM | POA: Diagnosis not present

## 2022-09-26 ENCOUNTER — Encounter: Payer: Self-pay | Admitting: Sports Medicine

## 2022-09-26 ENCOUNTER — Ambulatory Visit (INDEPENDENT_AMBULATORY_CARE_PROVIDER_SITE_OTHER): Payer: BC Managed Care – PPO | Admitting: Sports Medicine

## 2022-09-26 VITALS — BP 132/81 | HR 79 | Ht 74.0 in | Wt 225.0 lb

## 2022-09-26 DIAGNOSIS — Z0289 Encounter for other administrative examinations: Secondary | ICD-10-CM | POA: Diagnosis not present

## 2022-09-26 DIAGNOSIS — E669 Obesity, unspecified: Secondary | ICD-10-CM

## 2022-09-26 DIAGNOSIS — Z23 Encounter for immunization: Secondary | ICD-10-CM

## 2022-09-26 LAB — POCT URINALYSIS DIP (CLINITEK)
Bilirubin, UA: NEGATIVE
Blood, UA: NEGATIVE
Glucose, UA: NEGATIVE mg/dL
Ketones, POC UA: NEGATIVE mg/dL
Leukocytes, UA: NEGATIVE
Nitrite, UA: NEGATIVE
POC PROTEIN,UA: NEGATIVE
Spec Grav, UA: 1.025 (ref 1.010–1.025)
Urobilinogen, UA: 0.2 E.U./dL
pH, UA: 7 (ref 5.0–8.0)

## 2022-09-26 NOTE — Assessment & Plan Note (Signed)
FAA class II medical as above, see further details in the FAA system.

## 2022-09-26 NOTE — Addendum Note (Signed)
Addended by: Carren Rang A on: 09/26/2022 03:03 PM   Modules accepted: Orders

## 2022-09-26 NOTE — Progress Notes (Signed)
    Procedures performed today:    None.  Independent interpretation of notes and tests performed by another provider:   None.  Brief History, Exam, Impression, and Recommendations:    Encounter for [employer] (FAA) examination FAA class II medical as above, see further details in the FAA system.  I spent 30 minutes of total time managing this patient today, this includes chart review, face to face, and non-face to face time.  ____________________________________________ Ihor Austin. Benjamin Stain, M.D., ABFM., CAQSM., AME. Primary Care and Sports Medicine [facility]  Adjunct Professor of Family Medicine  [place] of [facility] of Medicine  Restaurant manager, fast food

## 2022-10-02 DIAGNOSIS — G4733 Obstructive sleep apnea (adult) (pediatric): Secondary | ICD-10-CM | POA: Diagnosis not present

## 2022-10-06 ENCOUNTER — Encounter: Payer: Self-pay | Admitting: Sports Medicine

## 2022-10-06 DIAGNOSIS — S83207D Unspecified tear of unspecified meniscus, current injury, left knee, subsequent encounter: Secondary | ICD-10-CM

## 2022-10-10 ENCOUNTER — Other Ambulatory Visit: Payer: Self-pay

## 2022-10-10 ENCOUNTER — Ambulatory Visit (INDEPENDENT_AMBULATORY_CARE_PROVIDER_SITE_OTHER): Payer: BC Managed Care – PPO | Admitting: Sports Medicine

## 2022-10-10 ENCOUNTER — Encounter: Payer: Self-pay | Admitting: Sports Medicine

## 2022-10-10 ENCOUNTER — Ambulatory Visit: Payer: BC Managed Care – PPO

## 2022-10-10 DIAGNOSIS — Z0189 Encounter for other specified special examinations: Secondary | ICD-10-CM

## 2022-10-10 DIAGNOSIS — M170 Bilateral primary osteoarthritis of knee: Secondary | ICD-10-CM | POA: Diagnosis not present

## 2022-10-10 DIAGNOSIS — S83207A Unspecified tear of unspecified meniscus, current injury, left knee, initial encounter: Secondary | ICD-10-CM

## 2022-10-10 DIAGNOSIS — M25562 Pain in left knee: Secondary | ICD-10-CM | POA: Diagnosis not present

## 2022-10-10 DIAGNOSIS — M25462 Effusion, left knee: Secondary | ICD-10-CM | POA: Diagnosis not present

## 2022-10-10 MED ORDER — HYDROCODONE-ACETAMINOPHEN 5-325 MG PO TABS: 1.0000 | ORAL_TABLET | Freq: Three times a day (TID) | ORAL | 0 refills | Status: AC | PRN

## 2022-10-10 NOTE — Assessment & Plan Note (Signed)
Pleasant 52 year old male, history of left-sided partial meniscectomy, mild osteoarthritis. Now with increasing pain, locking, popping. Pain is medial joint line, he is unable to extend past 5 degrees and unable to flex past 90 degrees, he does have a positive McMurray's sign. Mild effusion. Today I injected his knee, we do hydrocodone for pain, x-rays and a stat MRI for arthroscopy planning.

## 2022-10-10 NOTE — Progress Notes (Signed)
    Procedures performed today:    Procedure: Real-time Ultrasound Guided injection of the left knee Device: Samsung HS60  Verbal informed consent obtained.  Time-out conducted.  Noted no overlying erythema, induration, or other signs of local infection.  Skin prepped in a sterile fashion.  Local anesthesia: Topical Ethyl chloride.  With sterile technique and under real time ultrasound guidance: Trace effusion noted, 1 cc Kenalog 40, 2 cc lidocaine, 2 cc bupivacaine injected easily Completed without difficulty  Advised to call if fevers/chills, erythema, induration, drainage, or persistent bleeding.  Images permanently stored and available for review in PACS.  Impression: Technically successful ultrasound guided injection.  Independent interpretation of notes and tests performed by another provider:   None.  Brief History, Exam, Impression, and Recommendations:    Acute meniscal tear of left knee Pleasant 52 year old male, history of left-sided partial meniscectomy, mild osteoarthritis. Now with increasing pain, locking, popping. Pain is medial joint line, he is unable to extend past 5 degrees and unable to flex past 90 degrees, he does have a positive McMurray's sign. Mild effusion. Today I injected his knee, we do hydrocodone for pain, x-rays and a stat MRI for arthroscopy planning.    ____________________________________________ Ihor Austin. Benjamin Stain, M.D., ABFM., CAQSM., AME. Primary Care and Sports Medicine [facility]  Adjunct Professor of Family Medicine  [place] of [facility] of Medicine  Restaurant manager, fast food

## 2022-10-11 ENCOUNTER — Ambulatory Visit (HOSPITAL_COMMUNITY)
Admission: RE | Admit: 2022-10-11 | Discharge: 2022-10-11 | Disposition: A | Discharge status: Home / Self Care | Payer: BC Managed Care – PPO | Source: Ambulatory Visit | Attending: Sports Medicine | Admitting: Sports Medicine

## 2022-10-11 DIAGNOSIS — M948X6 Other specified disorders of cartilage, lower leg: Secondary | ICD-10-CM | POA: Diagnosis not present

## 2022-10-11 DIAGNOSIS — S83207A Unspecified tear of unspecified meniscus, current injury, left knee, initial encounter: Secondary | ICD-10-CM | POA: Insufficient documentation

## 2022-10-11 DIAGNOSIS — M7122 Synovial cyst of popliteal space [Baker], left knee: Secondary | ICD-10-CM | POA: Diagnosis not present

## 2022-10-11 DIAGNOSIS — M1712 Unilateral primary osteoarthritis, left knee: Secondary | ICD-10-CM | POA: Diagnosis not present

## 2022-10-11 DIAGNOSIS — S83232A Complex tear of medial meniscus, current injury, left knee, initial encounter: Secondary | ICD-10-CM | POA: Diagnosis not present

## 2022-10-11 MED ORDER — TRIAMCINOLONE ACETONIDE 40 MG/ML IJ SUSP
40.0000 mg | Freq: Once | INTRAMUSCULAR | Status: AC
Start: 2022-10-11 — End: 2022-10-11
  Administered 2022-10-11: 40 mg via INTRAMUSCULAR

## 2022-10-11 NOTE — Addendum Note (Signed)
Addended by: Carren Rang A on: 10/11/2022 10:37 AM   Modules accepted: Orders

## 2022-10-12 ENCOUNTER — Other Ambulatory Visit: Payer: BC Managed Care – PPO

## 2022-10-14 ENCOUNTER — Other Ambulatory Visit: Payer: BC Managed Care – PPO

## 2022-10-16 ENCOUNTER — Encounter: Payer: Self-pay | Admitting: Sports Medicine

## 2022-10-20 DIAGNOSIS — S83242A Other tear of medial meniscus, current injury, left knee, initial encounter: Secondary | ICD-10-CM | POA: Diagnosis not present

## 2022-11-01 DIAGNOSIS — G4733 Obstructive sleep apnea (adult) (pediatric): Secondary | ICD-10-CM | POA: Diagnosis not present

## 2022-11-20 ENCOUNTER — Other Ambulatory Visit (HOSPITAL_COMMUNITY): Payer: Self-pay

## 2022-11-20 DIAGNOSIS — M65912 Unspecified synovitis and tenosynovitis, left shoulder: Secondary | ICD-10-CM | POA: Diagnosis not present

## 2022-11-20 DIAGNOSIS — S83282A Other tear of lateral meniscus, current injury, left knee, initial encounter: Secondary | ICD-10-CM | POA: Diagnosis not present

## 2022-11-20 DIAGNOSIS — X58XXXA Exposure to other specified factors, initial encounter: Secondary | ICD-10-CM | POA: Diagnosis not present

## 2022-11-20 DIAGNOSIS — S83232A Complex tear of medial meniscus, current injury, left knee, initial encounter: Secondary | ICD-10-CM | POA: Diagnosis not present

## 2022-11-20 DIAGNOSIS — G8918 Other acute postprocedural pain: Secondary | ICD-10-CM | POA: Diagnosis not present

## 2022-11-20 DIAGNOSIS — Y999 Unspecified external cause status: Secondary | ICD-10-CM | POA: Diagnosis not present

## 2022-11-20 DIAGNOSIS — M94262 Chondromalacia, left knee: Secondary | ICD-10-CM | POA: Diagnosis not present

## 2022-11-20 MED ORDER — TRAMADOL HCL 50 MG PO TABS
50.0000 mg | ORAL_TABLET | Freq: Four times a day (QID) | ORAL | 0 refills | Status: AC
Start: 2022-11-20 — End: ?
  Filled 2022-11-20: qty 30, 8d supply, fill #0

## 2022-11-20 MED ORDER — MELOXICAM 15 MG PO TABS
15.0000 mg | ORAL_TABLET | Freq: Two times a day (BID) | ORAL | 1 refills | Status: DC
Start: 2022-11-20 — End: 2022-11-24
  Filled 2022-11-20: qty 60, 30d supply, fill #0

## 2022-11-20 MED ORDER — ASPIRIN 81 MG PO CHEW
81.0000 mg | CHEWABLE_TABLET | Freq: Two times a day (BID) | ORAL | 0 refills | Status: AC
  Filled 2022-11-20: qty 60, 30d supply, fill #0

## 2022-11-24 ENCOUNTER — Other Ambulatory Visit (HOSPITAL_COMMUNITY): Payer: Self-pay

## 2022-11-24 MED ORDER — MELOXICAM 15 MG PO TABS
15.0000 mg | ORAL_TABLET | Freq: Every day | ORAL | 1 refills | Status: AC | PRN
  Filled 2022-11-24 (×2): qty 30, 30d supply, fill #0

## 2022-11-27 ENCOUNTER — Other Ambulatory Visit (HOSPITAL_COMMUNITY): Payer: Self-pay

## 2022-12-15 IMAGING — US US ABDOMEN LIMITED
1 series · 14 of 25 positions shown · non-contrast
Comparison: None.

CLINICAL DATA: Right upper quadrant pain for 1 month

EXAM:
ULTRASOUND ABDOMEN LIMITED RIGHT UPPER QUADRANT

[Series 1: us abdomen limited · 0.19mm/px · 14 of 42 slices shown]
[im 1/42]
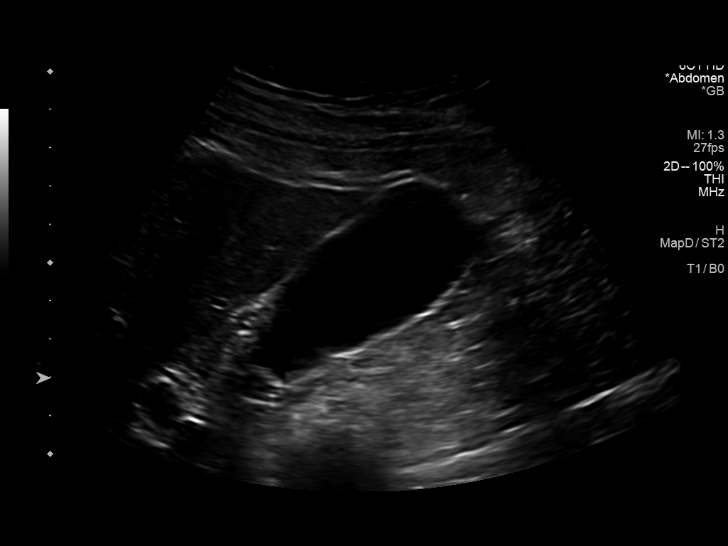
[im 4/42]
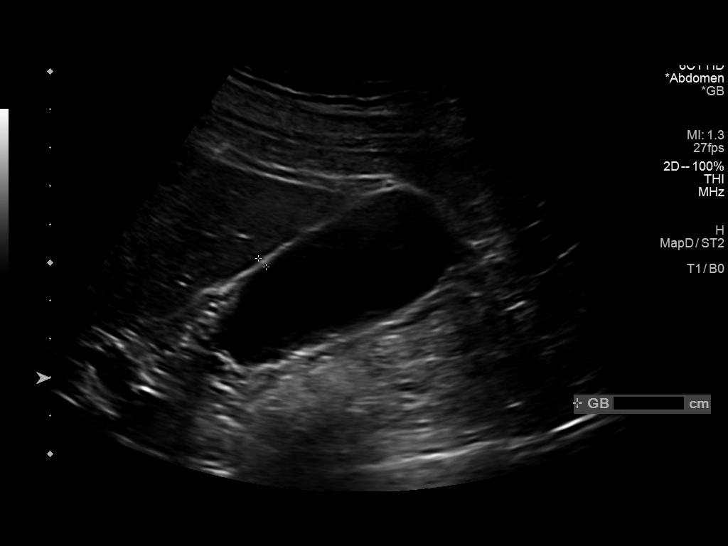
[im 7/42]
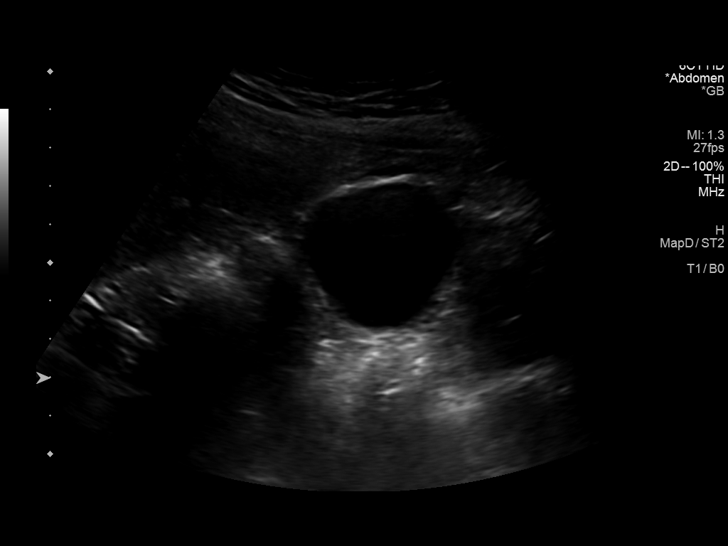
[im 11/42]
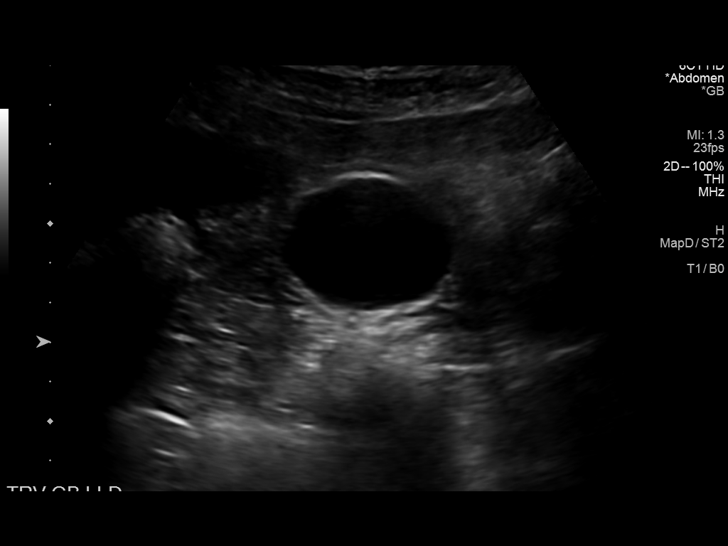
[im 14/42]
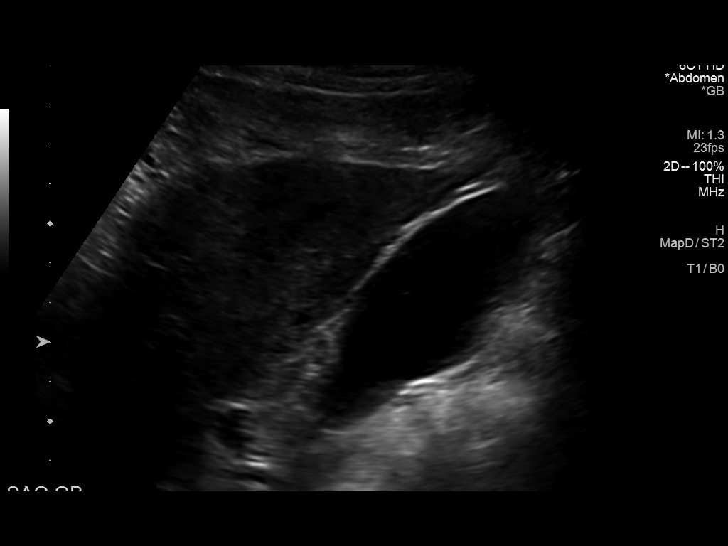
[im 16/42]
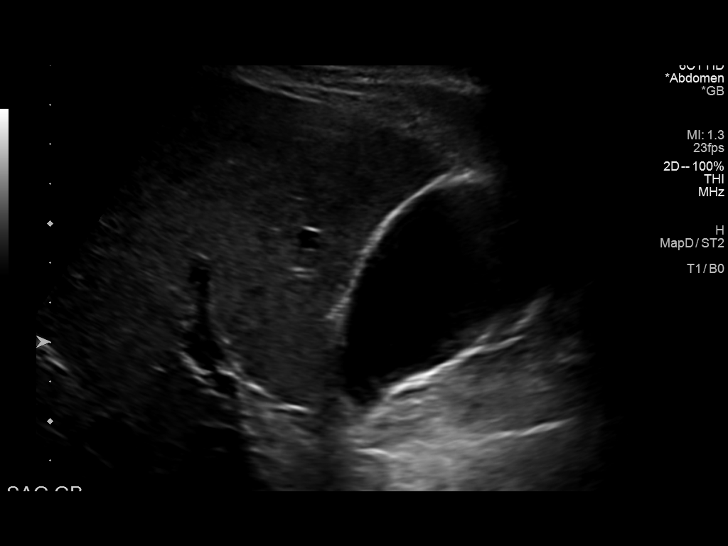
[im 19/42]
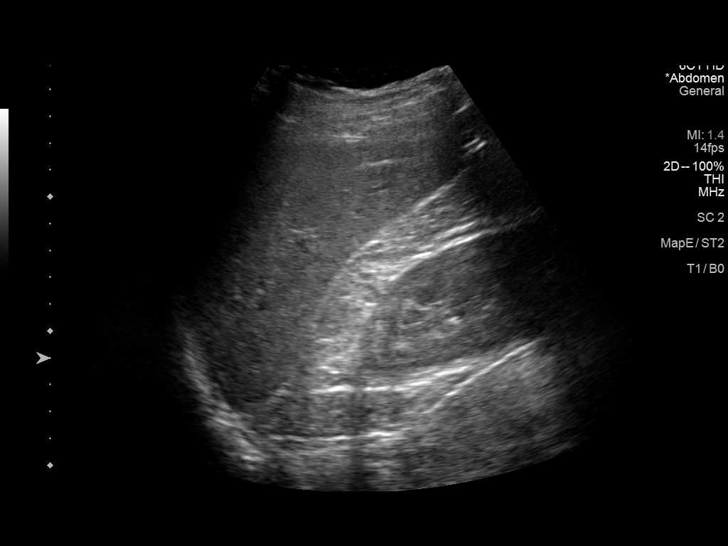
[im 23/42]
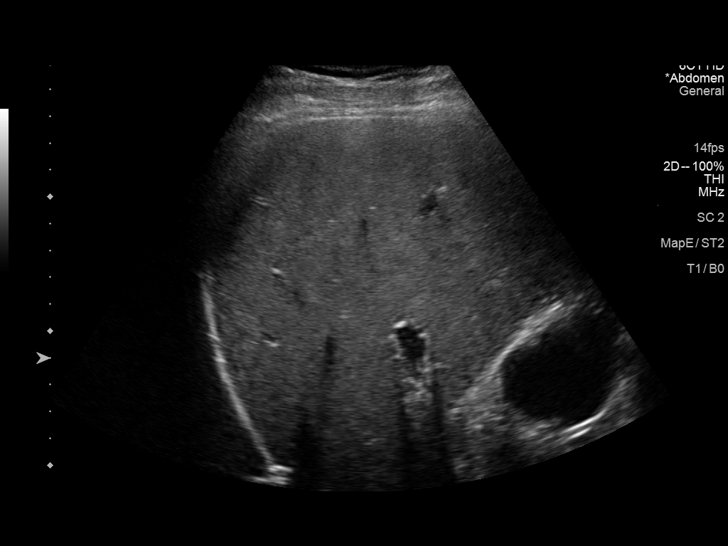
[im 26/42]
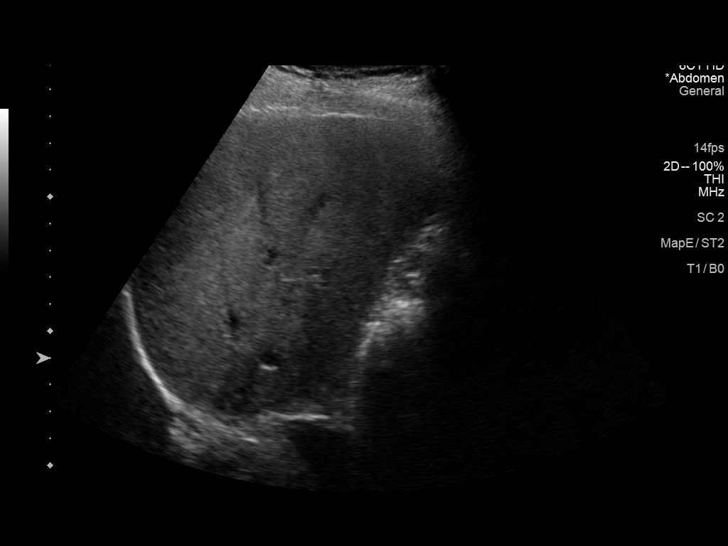
[im 28/42]
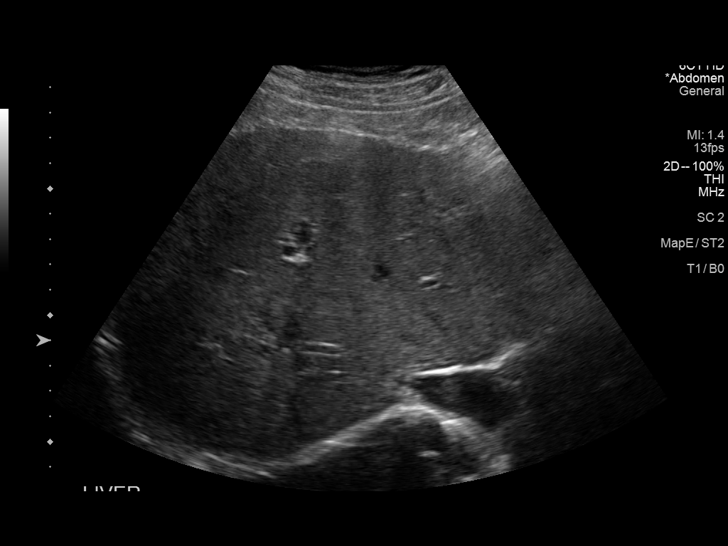
[im 31/42]
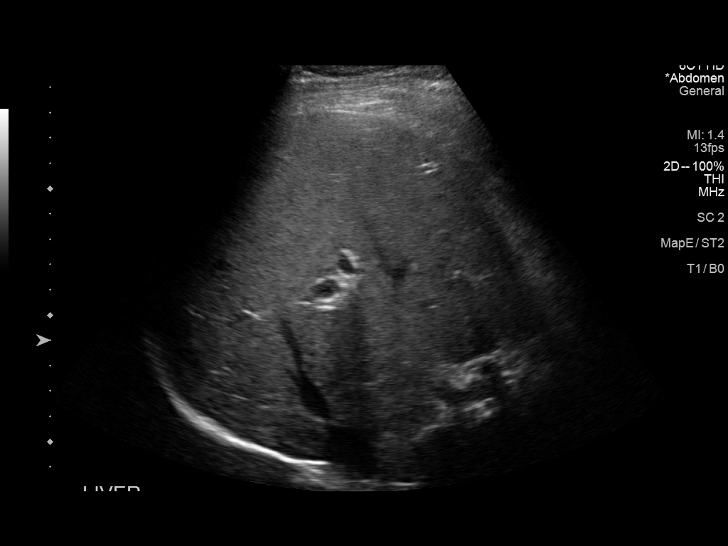
[im 35/42]
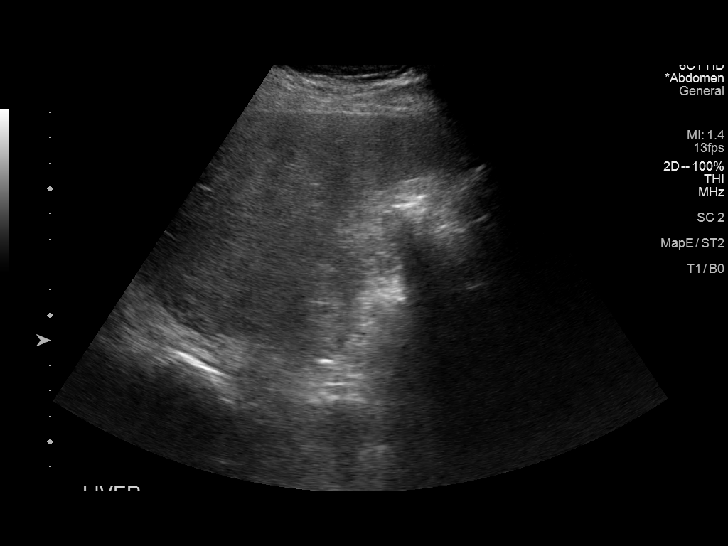
[im 38/42]
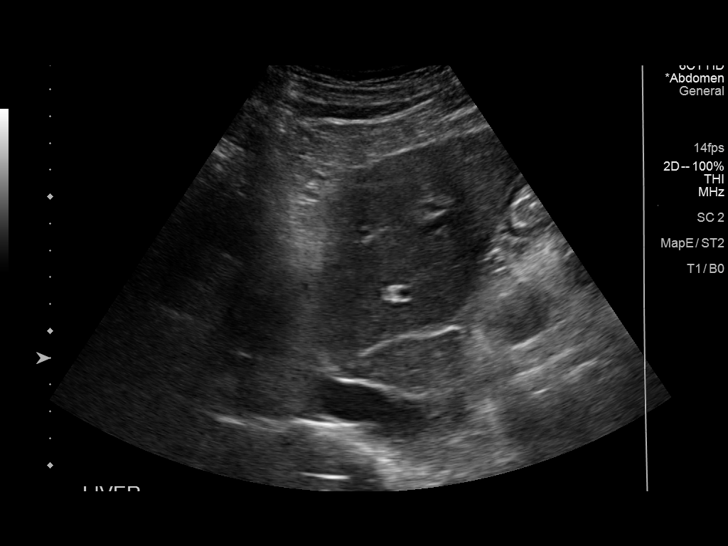
[im 42/42]
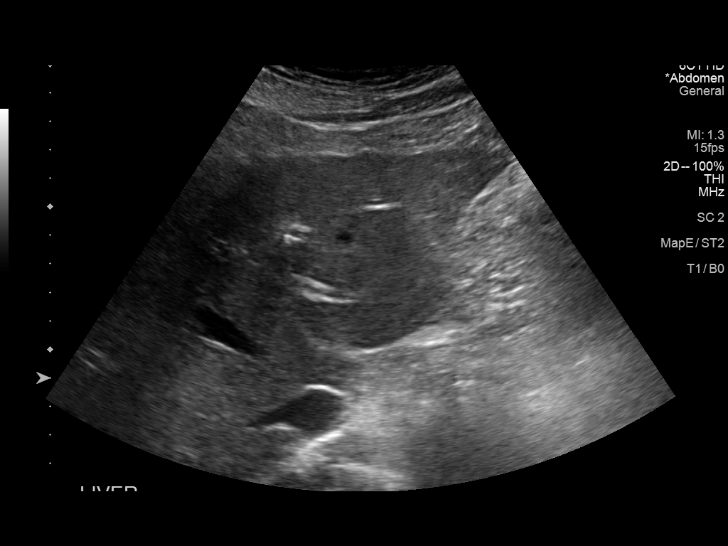

[14 of 25 positions shown; findings below may reference images not displayed]

FINDINGS: Gallbladder:

No gallstones or wall thickening visualized. No sonographic Murphy
sign noted by sonographer.

Common bile duct:

Diameter: 3 mm

Liver:

No focal lesion identified. Within normal limits in parenchymal
echogenicity. Portal vein is patent on color Doppler imaging with
normal direction of blood flow towards the liver.

Other: None.
IMPRESSION: Normal right upper quadrant ultrasound.

## 2023-01-21 DIAGNOSIS — S52501A Unspecified fracture of the lower end of right radius, initial encounter for closed fracture: Secondary | ICD-10-CM | POA: Diagnosis not present

## 2023-01-24 DIAGNOSIS — H04123 Dry eye syndrome of bilateral lacrimal glands: Secondary | ICD-10-CM | POA: Diagnosis not present

## 2023-01-24 DIAGNOSIS — H5213 Myopia, bilateral: Secondary | ICD-10-CM | POA: Diagnosis not present

## 2023-01-24 DIAGNOSIS — H52203 Unspecified astigmatism, bilateral: Secondary | ICD-10-CM | POA: Diagnosis not present

## 2023-01-25 ENCOUNTER — Other Ambulatory Visit (HOSPITAL_COMMUNITY): Payer: Self-pay

## 2023-01-29 DIAGNOSIS — R7989 Other specified abnormal findings of blood chemistry: Secondary | ICD-10-CM | POA: Diagnosis not present

## 2023-01-29 DIAGNOSIS — Z125 Encounter for screening for malignant neoplasm of prostate: Secondary | ICD-10-CM | POA: Diagnosis not present

## 2023-01-29 DIAGNOSIS — Z1212 Encounter for screening for malignant neoplasm of rectum: Secondary | ICD-10-CM | POA: Diagnosis not present

## 2023-01-29 DIAGNOSIS — E291 Testicular hypofunction: Secondary | ICD-10-CM | POA: Diagnosis not present

## 2023-01-31 DIAGNOSIS — S52501D Unspecified fracture of the lower end of right radius, subsequent encounter for closed fracture with routine healing: Secondary | ICD-10-CM | POA: Diagnosis not present

## 2023-02-05 DIAGNOSIS — Z1339 Encounter for screening examination for other mental health and behavioral disorders: Secondary | ICD-10-CM | POA: Diagnosis not present

## 2023-02-05 DIAGNOSIS — Z0000 Encounter for general adult medical examination without abnormal findings: Secondary | ICD-10-CM | POA: Diagnosis not present

## 2023-02-05 DIAGNOSIS — G4733 Obstructive sleep apnea (adult) (pediatric): Secondary | ICD-10-CM | POA: Diagnosis not present

## 2023-02-05 DIAGNOSIS — R82998 Other abnormal findings in urine: Secondary | ICD-10-CM | POA: Diagnosis not present

## 2023-02-05 DIAGNOSIS — Z1331 Encounter for screening for depression: Secondary | ICD-10-CM | POA: Diagnosis not present

## 2023-02-21 ENCOUNTER — Encounter (HOSPITAL_BASED_OUTPATIENT_CLINIC_OR_DEPARTMENT_OTHER): Payer: Self-pay | Admitting: Pulmonary Disease

## 2023-02-28 DIAGNOSIS — S52501D Unspecified fracture of the lower end of right radius, subsequent encounter for closed fracture with routine healing: Secondary | ICD-10-CM | POA: Diagnosis not present

## 2023-03-01 DIAGNOSIS — G4733 Obstructive sleep apnea (adult) (pediatric): Secondary | ICD-10-CM | POA: Diagnosis not present

## 2023-03-28 DIAGNOSIS — S52501D Unspecified fracture of the lower end of right radius, subsequent encounter for closed fracture with routine healing: Secondary | ICD-10-CM | POA: Diagnosis not present

## 2023-03-29 DIAGNOSIS — G4733 Obstructive sleep apnea (adult) (pediatric): Secondary | ICD-10-CM | POA: Diagnosis not present

## 2023-05-15 DIAGNOSIS — Z85828 Personal history of other malignant neoplasm of skin: Secondary | ICD-10-CM | POA: Diagnosis not present

## 2023-05-15 DIAGNOSIS — D2261 Melanocytic nevi of right upper limb, including shoulder: Secondary | ICD-10-CM | POA: Diagnosis not present

## 2023-05-15 DIAGNOSIS — L821 Other seborrheic keratosis: Secondary | ICD-10-CM | POA: Diagnosis not present

## 2023-05-15 DIAGNOSIS — D225 Melanocytic nevi of trunk: Secondary | ICD-10-CM | POA: Diagnosis not present

## 2023-09-04 ENCOUNTER — Encounter: Payer: Self-pay | Admitting: Sports Medicine

## 2023-11-16 ENCOUNTER — Other Ambulatory Visit (HOSPITAL_COMMUNITY): Payer: Self-pay
